# Patient Record
Sex: Female | Born: 1975
Health system: Southern US, Community
[De-identification: ages and names within clinical notes are randomized; demographics above are authoritative.]

## PROBLEM LIST (undated history)

## (undated) DIAGNOSIS — E119 Type 2 diabetes mellitus without complications: Secondary | ICD-10-CM

## (undated) DIAGNOSIS — E785 Hyperlipidemia, unspecified: Secondary | ICD-10-CM

## (undated) DIAGNOSIS — J45909 Unspecified asthma, uncomplicated: Secondary | ICD-10-CM

## (undated) DIAGNOSIS — I10 Essential (primary) hypertension: Secondary | ICD-10-CM

## (undated) DIAGNOSIS — D500 Iron deficiency anemia secondary to blood loss (chronic): Secondary | ICD-10-CM

## (undated) HISTORY — PX: CHOLECYSTECTOMY: SHX55

## (undated) HISTORY — DX: Hyperlipidemia, unspecified: E78.5

## (undated) HISTORY — DX: Type 2 diabetes mellitus without complications: E11.9

## (undated) HISTORY — DX: Iron deficiency anemia secondary to blood loss (chronic): D50.0

## (undated) HISTORY — DX: Essential (primary) hypertension: I10

## (undated) HISTORY — DX: Unspecified asthma, uncomplicated: J45.909

## (undated) MED FILL — Iron Sucrose Inj 20 MG/ML (Fe Equiv): INTRAVENOUS | Qty: 10 | Status: AC

---

## 2011-01-07 ENCOUNTER — Emergency Department: Payer: Self-pay | Admitting: Emergency Medicine
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
## 2017-02-08 DIAGNOSIS — N920 Excessive and frequent menstruation with regular cycle: Secondary | ICD-10-CM

## 2017-02-08 DIAGNOSIS — R21 Rash and other nonspecific skin eruption: Secondary | ICD-10-CM

## 2017-02-08 DIAGNOSIS — D473 Essential (hemorrhagic) thrombocythemia: Secondary | ICD-10-CM

## 2017-02-08 DIAGNOSIS — D509 Iron deficiency anemia, unspecified: Secondary | ICD-10-CM

## 2017-03-27 DIAGNOSIS — Z862 Personal history of diseases of the blood and blood-forming organs and certain disorders involving the immune mechanism: Secondary | ICD-10-CM | POA: Diagnosis not present

## 2017-12-05 DIAGNOSIS — I10 Essential (primary) hypertension: Secondary | ICD-10-CM | POA: Diagnosis not present

## 2017-12-05 DIAGNOSIS — N920 Excessive and frequent menstruation with regular cycle: Secondary | ICD-10-CM | POA: Diagnosis not present

## 2017-12-05 DIAGNOSIS — D508 Other iron deficiency anemias: Secondary | ICD-10-CM | POA: Diagnosis not present

## 2018-04-03 DIAGNOSIS — D696 Thrombocytopenia, unspecified: Secondary | ICD-10-CM | POA: Diagnosis not present

## 2018-04-03 DIAGNOSIS — D509 Iron deficiency anemia, unspecified: Secondary | ICD-10-CM | POA: Diagnosis not present

## 2018-04-29 DIAGNOSIS — Z1231 Encounter for screening mammogram for malignant neoplasm of breast: Secondary | ICD-10-CM | POA: Diagnosis not present

## 2018-05-28 DIAGNOSIS — N939 Abnormal uterine and vaginal bleeding, unspecified: Secondary | ICD-10-CM | POA: Diagnosis not present

## 2018-06-04 DIAGNOSIS — D500 Iron deficiency anemia secondary to blood loss (chronic): Secondary | ICD-10-CM | POA: Diagnosis not present

## 2018-06-04 DIAGNOSIS — D508 Other iron deficiency anemias: Secondary | ICD-10-CM | POA: Diagnosis not present

## 2018-06-04 DIAGNOSIS — N920 Excessive and frequent menstruation with regular cycle: Secondary | ICD-10-CM | POA: Diagnosis not present

## 2018-06-12 DIAGNOSIS — D500 Iron deficiency anemia secondary to blood loss (chronic): Secondary | ICD-10-CM | POA: Diagnosis not present

## 2018-06-18 DIAGNOSIS — D500 Iron deficiency anemia secondary to blood loss (chronic): Secondary | ICD-10-CM | POA: Diagnosis not present

## 2018-08-18 DIAGNOSIS — D500 Iron deficiency anemia secondary to blood loss (chronic): Secondary | ICD-10-CM | POA: Diagnosis not present

## 2018-08-18 DIAGNOSIS — N920 Excessive and frequent menstruation with regular cycle: Secondary | ICD-10-CM | POA: Diagnosis not present

## 2018-08-18 DIAGNOSIS — D508 Other iron deficiency anemias: Secondary | ICD-10-CM | POA: Diagnosis not present

## 2018-12-30 DIAGNOSIS — D500 Iron deficiency anemia secondary to blood loss (chronic): Secondary | ICD-10-CM | POA: Diagnosis not present

## 2018-12-30 DIAGNOSIS — N920 Excessive and frequent menstruation with regular cycle: Secondary | ICD-10-CM | POA: Diagnosis not present

## 2019-01-06 DIAGNOSIS — D500 Iron deficiency anemia secondary to blood loss (chronic): Secondary | ICD-10-CM | POA: Diagnosis not present

## 2019-01-13 DIAGNOSIS — D500 Iron deficiency anemia secondary to blood loss (chronic): Secondary | ICD-10-CM | POA: Diagnosis not present

## 2019-03-24 DIAGNOSIS — D500 Iron deficiency anemia secondary to blood loss (chronic): Secondary | ICD-10-CM | POA: Diagnosis not present

## 2019-03-24 DIAGNOSIS — N920 Excessive and frequent menstruation with regular cycle: Secondary | ICD-10-CM | POA: Diagnosis not present

## 2019-03-24 DIAGNOSIS — D509 Iron deficiency anemia, unspecified: Secondary | ICD-10-CM | POA: Diagnosis not present

## 2019-04-01 DIAGNOSIS — R030 Elevated blood-pressure reading, without diagnosis of hypertension: Secondary | ICD-10-CM | POA: Diagnosis not present

## 2019-07-09 DIAGNOSIS — D649 Anemia, unspecified: Secondary | ICD-10-CM

## 2019-07-09 DIAGNOSIS — D500 Iron deficiency anemia secondary to blood loss (chronic): Secondary | ICD-10-CM

## 2020-06-16 DIAGNOSIS — D649 Anemia, unspecified: Secondary | ICD-10-CM | POA: Diagnosis not present

## 2020-08-25 ENCOUNTER — Other Ambulatory Visit: Payer: Self-pay | Admitting: Hematology and Oncology

## 2020-08-25 ENCOUNTER — Encounter: Payer: Self-pay | Admitting: Hematology and Oncology

## 2020-08-25 DIAGNOSIS — D500 Iron deficiency anemia secondary to blood loss (chronic): Secondary | ICD-10-CM | POA: Diagnosis not present

## 2020-08-25 DIAGNOSIS — D509 Iron deficiency anemia, unspecified: Secondary | ICD-10-CM

## 2020-08-25 HISTORY — DX: Iron deficiency anemia, unspecified: D50.9

## 2020-08-26 ENCOUNTER — Encounter: Payer: Self-pay | Admitting: Hematology and Oncology

## 2020-08-30 ENCOUNTER — Other Ambulatory Visit: Payer: Self-pay | Admitting: Pharmacist

## 2020-09-02 ENCOUNTER — Other Ambulatory Visit: Payer: Self-pay

## 2020-09-02 ENCOUNTER — Inpatient Hospital Stay: Payer: 59 | Attending: Oncology

## 2020-09-02 VITALS — BP 154/69 | HR 84 | Temp 97.9°F | Resp 20

## 2020-09-02 DIAGNOSIS — N920 Excessive and frequent menstruation with regular cycle: Secondary | ICD-10-CM | POA: Insufficient documentation

## 2020-09-02 DIAGNOSIS — D500 Iron deficiency anemia secondary to blood loss (chronic): Secondary | ICD-10-CM

## 2020-09-02 MED ORDER — SODIUM CHLORIDE 0.9 % IV SOLN
Freq: Once | INTRAVENOUS | Status: AC
  Filled 2020-09-02: qty 250

## 2020-09-02 MED ORDER — SODIUM CHLORIDE 0.9 % IV SOLN
200.0000 mg | Freq: Once | INTRAVENOUS | Status: AC
  Administered 2020-09-02: 200 mg via INTRAVENOUS
  Filled 2020-09-02: qty 200

## 2020-09-02 NOTE — Progress Notes (Signed)
Pt stable at time of discharge. 

## 2020-09-02 NOTE — Patient Instructions (Signed)

## 2020-09-05 ENCOUNTER — Inpatient Hospital Stay: Payer: 59

## 2020-09-05 NOTE — Telephone Encounter (Signed)
This encounter was created in error - please disregard.

## 2020-09-06 ENCOUNTER — Inpatient Hospital Stay: Payer: 59

## 2020-09-06 ENCOUNTER — Other Ambulatory Visit: Payer: Self-pay

## 2020-09-06 VITALS — BP 139/70 | HR 85 | Temp 98.4°F | Resp 18 | Ht 60.0 in | Wt 354.2 lb

## 2020-09-06 DIAGNOSIS — D500 Iron deficiency anemia secondary to blood loss (chronic): Secondary | ICD-10-CM

## 2020-09-06 MED ORDER — SODIUM CHLORIDE 0.9 % IV SOLN
200.0000 mg | Freq: Once | INTRAVENOUS | Status: AC
  Administered 2020-09-06: 200 mg via INTRAVENOUS
  Filled 2020-09-06: qty 200

## 2020-09-06 MED ORDER — SODIUM CHLORIDE 0.9 % IV SOLN
Freq: Once | INTRAVENOUS | Status: AC
  Filled 2020-09-06: qty 250

## 2020-09-06 NOTE — Patient Instructions (Signed)
Iron Deficiency Anemia, Adult Iron-deficiency anemia is when you have a low amount of red blood cells or hemoglobin. This happens because you have too little iron in your body. Hemoglobin carries oxygen to parts of the body. Anemia can cause your body to not get enough oxygen. It may or may not cause symptoms. Follow these instructions at home: Medicines  Take over-the-counter and prescription medicines only as told by your doctor. This includes iron pills (supplements) and vitamins.  If you cannot handle taking iron pills by mouth, ask your doctor about getting iron through: ? A vein (intravenously). ? A shot (injection) into a muscle.  Take iron pills when your stomach is empty. If you cannot handle this, take them with food.  Do not drink milk or take antacids at the same time as your iron pills.  To prevent trouble pooping (constipation), eat fiber or take medicine (stool softener) as told by your doctor. Eating and drinking   Talk with your doctor before changing the foods you eat. He or she may tell you to eat foods that have a lot of iron, such as: ? Liver. ? Lowfat (lean) beef. ? Breads and cereals that have iron added to them (fortified breads and cereals). ? Eggs. ? Dried fruit. ? Dark green, leafy vegetables.  Drink enough fluid to keep your pee (urine) clear or pale yellow.  Eat fresh fruits and vegetables that are high in vitamin C. They help your body to use iron. Foods with a lot of vitamin C include: ? Oranges. ? Peppers. ? Tomatoes. ? Mangoes. General instructions  Return to your normal activities as told by your doctor. Ask your doctor what activities are safe for you.  Keep yourself clean, and keep things clean around you (your surroundings). Anemia can make you get sick more easily.  Keep all follow-up visits as told by your doctor. This is important. Contact a doctor if:  You feel sick to your stomach (nauseous).  You throw up (vomit).  You feel  weak.  You are sweating for no clear reason.  You have trouble pooping, such as: ? Pooping (having a bowel movement) less than 3 times a week. ? Straining to poop. ? Having poop that is hard, dry, or larger than normal. ? Feeling full or bloated. ? Pain in the lower belly. ? Not feeling better after pooping. Get help right away if:  You pass out (faint). If this happens, do not drive yourself to the hospital. Call your local emergency services (911 in the [place]  You have chest pain.  You have shortness of breath that: ? Is very bad. ? Gets worse with physical activity.  You have a fast heartbeat.  You get light-headed when getting up from sitting or lying down. This information is not intended to replace advice given to you by your health care provider. Make sure you discuss any questions you have with your health care provider. Document Revised: 10/18/2017 Document Reviewed: 07/25/2016 Elsevier Patient Education  [place]. Iron Sucrose injection What is this medicine? IRON SUCROSE (AHY ern SOO krohs) is an iron complex. Iron is used to make healthy red blood cells, which carry oxygen and nutrients throughout the body. This medicine is used to treat iron deficiency anemia in people with chronic kidney disease. This medicine may be used for other purposes; ask your health care provider or pharmacist if you have questions. COMMON BRAND NAME(S): Venofer What should I tell my health care provider before I take  this medicine? They need to know if you have any of these conditions:  anemia not caused by low iron levels  heart disease  high levels of iron in the blood  kidney disease  liver disease  an unusual or allergic reaction to iron, other medicines, foods, dyes, or preservatives  pregnant or trying to get pregnant  breast-feeding How should I use this medicine? This medicine is for infusion into a vein. It is given by a health care professional in a  hospital or clinic setting. Talk to your pediatrician regarding the use of this medicine in children. While this drug may be prescribed for children as young as 2 years for selected conditions, precautions do apply. Overdosage: If you think you have taken too much of this medicine contact a poison control center or emergency room at once. NOTE: This medicine is only for you. Do not share this medicine with others. What if I miss a dose? It is important not to miss your dose. Call your doctor or health care professional if you are unable to keep an appointment. What may interact with this medicine? Do not take this medicine with any of the following medications:  deferoxamine  dimercaprol  other iron products This medicine may also interact with the following medications:  chloramphenicol  deferasirox This list may not describe all possible interactions. Give your health care provider a list of all the medicines, herbs, non-prescription drugs, or dietary supplements you use. Also tell them if you smoke, drink alcohol, or use illegal drugs. Some items may interact with your medicine. What should I watch for while using this medicine? Visit your doctor or healthcare professional regularly. Tell your doctor or healthcare professional if your symptoms do not start to get better or if they get worse. You may need blood work done while you are taking this medicine. You may need to follow a special diet. Talk to your doctor. Foods that contain iron include: whole grains/cereals, dried fruits, beans, or peas, leafy green vegetables, and organ meats (liver, kidney). What side effects may I notice from receiving this medicine? Side effects that you should report to your doctor or health care professional as soon as possible:  allergic reactions like skin rash, itching or hives, swelling of the face, lips, or tongue  breathing problems  changes in blood pressure  cough  fast, irregular  heartbeat  feeling faint or lightheaded, falls  fever or chills  flushing, sweating, or hot feelings  joint or muscle aches/pains  seizures  swelling of the ankles or feet  unusually weak or tired Side effects that usually do not require medical attention (report to your doctor or health care professional if they continue or are bothersome):  diarrhea  feeling achy  headache  irritation at site where injected  nausea, vomiting  stomach upset  tiredness This list may not describe all possible side effects. Call your doctor for medical advice about side effects. You may report side effects to FDA at 1-800-FDA-1088. Where should I keep my medicine? This drug is given in a hospital or clinic and will not be stored at home. NOTE: This sheet is a summary. It may not cover all possible information. If you have questions about this medicine, talk to your doctor, pharmacist, or health care provider.  2020 Elsevier/Gold Standard (2011-08-16 17:14:35)

## 2020-09-06 NOTE — Progress Notes (Signed)
Pt stable at time of discharge. 

## 2020-09-08 ENCOUNTER — Other Ambulatory Visit: Payer: Self-pay

## 2020-09-08 ENCOUNTER — Inpatient Hospital Stay: Payer: 59

## 2020-09-08 DIAGNOSIS — D500 Iron deficiency anemia secondary to blood loss (chronic): Secondary | ICD-10-CM

## 2020-09-08 MED ORDER — SODIUM CHLORIDE 0.9 % IV SOLN
200.0000 mg | Freq: Once | INTRAVENOUS | Status: AC
  Administered 2020-09-08: 200 mg via INTRAVENOUS
  Filled 2020-09-08: qty 200

## 2020-09-08 MED ORDER — SODIUM CHLORIDE 0.9 % IV SOLN
Freq: Once | INTRAVENOUS | Status: AC
  Filled 2020-09-08: qty 250

## 2020-09-08 NOTE — Progress Notes (Signed)
PT STABLE AT TIME OF DISCHARGE 

## 2020-09-08 NOTE — Patient Instructions (Signed)

## 2020-09-12 ENCOUNTER — Inpatient Hospital Stay: Payer: 59

## 2020-09-12 ENCOUNTER — Other Ambulatory Visit: Payer: Self-pay

## 2020-09-12 VITALS — BP 161/70 | HR 78 | Temp 98.3°F | Resp 18

## 2020-09-12 DIAGNOSIS — D500 Iron deficiency anemia secondary to blood loss (chronic): Secondary | ICD-10-CM | POA: Diagnosis not present

## 2020-09-12 MED ORDER — SODIUM CHLORIDE 0.9 % IV SOLN
Freq: Once | INTRAVENOUS | Status: AC
  Filled 2020-09-12: qty 250

## 2020-09-12 MED ORDER — SODIUM CHLORIDE 0.9 % IV SOLN
200.0000 mg | Freq: Once | INTRAVENOUS | Status: AC
  Administered 2020-09-12: 200 mg via INTRAVENOUS
  Filled 2020-09-12: qty 200

## 2020-09-12 NOTE — Progress Notes (Signed)
PT STABLE AT TIME OF DISCHARGE 1610

## 2020-09-12 NOTE — Patient Instructions (Signed)

## 2020-09-14 ENCOUNTER — Other Ambulatory Visit: Payer: Self-pay

## 2020-09-14 ENCOUNTER — Inpatient Hospital Stay: Payer: 59

## 2020-09-14 VITALS — BP 144/74 | HR 73 | Temp 98.9°F | Resp 18 | Ht 65.0 in | Wt 353.2 lb

## 2020-09-14 DIAGNOSIS — D500 Iron deficiency anemia secondary to blood loss (chronic): Secondary | ICD-10-CM

## 2020-09-14 MED ORDER — SODIUM CHLORIDE 0.9 % IV SOLN
200.0000 mg | Freq: Once | INTRAVENOUS | Status: AC
  Administered 2020-09-14: 200 mg via INTRAVENOUS
  Filled 2020-09-14: qty 200

## 2020-09-14 MED ORDER — SODIUM CHLORIDE 0.9 % IV SOLN
Freq: Once | INTRAVENOUS | Status: AC
  Filled 2020-09-14: qty 250

## 2020-09-14 NOTE — Progress Notes (Signed)
PT STABLE AT TIME OF DISCHARGE 

## 2020-09-14 NOTE — Patient Instructions (Signed)

## 2020-09-15 NOTE — Progress Notes (Signed)
PT STABLE AT TIME OF DISCHARGE 

## 2020-09-27 ENCOUNTER — Other Ambulatory Visit: Payer: Self-pay | Admitting: Hematology and Oncology

## 2020-09-27 DIAGNOSIS — D500 Iron deficiency anemia secondary to blood loss (chronic): Secondary | ICD-10-CM

## 2020-10-06 NOTE — Progress Notes (Signed)
[facility]  [address] [place],  [place]  [address] [phone]  Clinic Day:  10/06/2020  Referring physician: Derwood Kaplan, MD   This document serves as a record of services personally performed by Hosie Poisson, MD. It was created on their behalf by Curry,Lauren E, a trained medical scribe. The creation of this record is based on the scribe's personal observations and the provider's statements to them.   CHIEF COMPLAINT:  CC:  Longstanding iron deficiency anemia  Current Treatment:  Intermittent IV Feraheme   HISTORY OF PRESENT ILLNESS:  Leah Mendoza is a 44 y.o. female with microcytic anemia secondary to iron deficiency with associated reactive thrombocytosis.  This has been an ongoing problem for years.  She does not tolerate oral iron supplementation.  This has been felt to be secondary to heavy menstrual periods.  She has been referred to gynecology in the past, but apparently there are financial issues.  She had an EGD in April 2013 with Dr. Lyda Jester for upper abdominal pain, which did not reveal any abnormality.  She had colonoscopy with Dr. Melina Copa in June 2015, which was also normal.  Routine screening colonoscopy at age 27 was recommended.  She has received numerous IV iron infusions over the years when she becomes more anemic and symptomatic.  CT abdomen and pelvis in June 2015 revealed endometrial thickening, for which we referred her to [facility] in June 2015, but she never went.  Repeat CT abdomen and pelvis and February 2016 in the emergency department revealed a left adnexal soft tissue density most consistent with an ovarian cyst.  Pelvic ultrasound was recommended and revealed bilateral ovarian cysts.  The endometrium looked normal on the ultrasound.  She was referred back to [facility] again in May 2019, but did not go.  We started screening mammograms in June 2019.  Most recent screening  mammogram in July 2020 did not reveal any evidence of malignancy.  She received IV Feraheme again in February and March of 2021 due to recurrent iron deficiency, at which time her hemoglobin was 10.7.     INTERVAL HISTORY:  Leah Mendoza is here for routine follow up and states that she has been well since receiving IV Feraheme in October.  She just finished her menstrual cycle and states that this cycle was very heavy.  Otherwise, she denies complaints.  Her  appetite is good, and her weight is stable since her last visit.  She denies fever, chills or other signs of infection.  She denies nausea, vomiting, bowel issues, or abdominal pain.  She denies sore throat, cough, dyspnea, or chest pain.   REVIEW OF SYSTEMS:  Review of Systems  Genitourinary: Positive for menstrual problem (very heavy menstrual cycles).   All other systems reviewed and are negative.    VITALS:  There were no vitals taken for this visit.  Wt Readings from Last 3 Encounters:  09/14/20 (!) 353 lb 4 oz (160.2 kg)  09/12/20 (!) 355 lb 7 oz (161.2 kg)  09/12/20 (!) 355 lb 7 oz (161.2 kg)    There is no height or weight on file to calculate BMI.  Performance status (ECOG): 1 - Symptomatic but completely ambulatory  PHYSICAL EXAM:  Physical Exam Constitutional:      General: She is not in acute distress.    Appearance: Normal appearance. She is obese.  HENT:     Head: Normocephalic and atraumatic.  Eyes:     General: No scleral  icterus.    Extraocular Movements: Extraocular movements intact.     Conjunctiva/sclera: Conjunctivae normal.     Pupils: Pupils are equal, round, and reactive to light.  Cardiovascular:     Rate and Rhythm: Normal rate and regular rhythm.     Pulses: Normal pulses.     Heart sounds: Normal heart sounds. No murmur heard.  No friction rub. No gallop.   Pulmonary:     Effort: Pulmonary effort is normal. No respiratory distress.     Breath sounds: Normal breath sounds.  Abdominal:      General: Bowel sounds are normal. There is no distension.     Palpations: Abdomen is soft. There is no mass.     Tenderness: There is no abdominal tenderness.  Musculoskeletal:        General: Normal range of motion.     Cervical back: Normal range of motion and neck supple.     Right lower leg: No edema.     Left lower leg: No edema.  Lymphadenopathy:     Cervical: No cervical adenopathy.  Skin:    General: Skin is warm and dry.  Neurological:     General: No focal deficit present.     Mental Status: She is alert and oriented to person, place, and time. Mental status is at baseline.  Psychiatric:        Mood and Affect: Mood normal.        Behavior: Behavior normal.        Thought Content: Thought content normal.        Judgment: Judgment normal.     LABS:  No flowsheet data found. No flowsheet data found.  No results found for: TIBC, FERRITIN, IRONPCTSAT   STUDIES:  No results found.   Allergies:  Allergies  Allergen Reactions  . Latex     UNKNOWN    Current Medications: Current Outpatient Medications  Medication Sig Dispense Refill  . acetaminophen (TYLENOL) 325 MG tablet Take 650 mg by mouth every 6 (six) hours as needed.     No current facility-administered medications for this visit.     ASSESSMENT & PLAN:   Assessment:   1. Longstanding iron deficiency anemia due to heavy menses.  She has had an excellent response to IV Feraheme, but requires recurring doses due to ongoing blood loss.  She received IV Feraheme infusion in March and October of this year.    Plan: We will see her back in 3 months with CBC, CMP, iand ron studes for repeat evaluation.  She verbalizes understanding of an agreement to the plans discussed today.  She knows to call the office should any new questions or concerns arise.   I provided 10 minutes of face-to-face time during this this encounter and > 50% was spent counseling as documented under my assessment and plan.     Derwood Kaplan, MD [facility] [address] [place] [address] Dept: [phone] Dept Fax: [phone]   I, Rita Ohara, am acting as scribe for Derwood Kaplan, MD  I have reviewed this report as typed by the medical scribe, and it is complete and accurate.

## 2020-10-07 ENCOUNTER — Other Ambulatory Visit: Payer: Self-pay

## 2020-10-07 ENCOUNTER — Inpatient Hospital Stay (INDEPENDENT_AMBULATORY_CARE_PROVIDER_SITE_OTHER): Payer: 59 | Admitting: Oncology

## 2020-10-07 ENCOUNTER — Inpatient Hospital Stay: Payer: 59 | Attending: Oncology

## 2020-10-07 ENCOUNTER — Telehealth: Payer: Self-pay | Admitting: Oncology

## 2020-10-07 ENCOUNTER — Other Ambulatory Visit: Payer: Self-pay | Admitting: Oncology

## 2020-10-07 ENCOUNTER — Encounter: Payer: Self-pay | Admitting: Oncology

## 2020-10-07 VITALS — BP 170/81 | HR 77 | Temp 98.8°F | Resp 18 | Ht 65.0 in | Wt 355.0 lb

## 2020-10-07 DIAGNOSIS — N920 Excessive and frequent menstruation with regular cycle: Secondary | ICD-10-CM | POA: Insufficient documentation

## 2020-10-07 DIAGNOSIS — D500 Iron deficiency anemia secondary to blood loss (chronic): Secondary | ICD-10-CM

## 2020-10-07 LAB — CBC WITH DIFFERENTIAL (CANCER CENTER ONLY)
Abs Immature Granulocytes: 0.02 10*3/uL (ref 0.00–0.07)
Basophils Absolute: 0.1 10*3/uL (ref 0.0–0.1)
Basophils Relative: 1 %
Eosinophils Absolute: 0.3 10*3/uL (ref 0.0–0.5)
Eosinophils Relative: 3 %
HCT: 41.3 % (ref 36.0–46.0)
Hemoglobin: 12.9 g/dL (ref 12.0–15.0)
Immature Granulocytes: 0 %
Lymphocytes Relative: 33 %
Lymphs Abs: 2.7 10*3/uL (ref 0.7–4.0)
MCH: 27.4 pg (ref 26.0–34.0)
MCHC: 31.2 g/dL (ref 30.0–36.0)
MCV: 87.7 fL (ref 80.0–100.0)
Monocytes Absolute: 0.6 10*3/uL (ref 0.1–1.0)
Monocytes Relative: 7 %
Neutro Abs: 4.6 10*3/uL (ref 1.7–7.7)
Neutrophils Relative %: 56 %
Platelet Count: 383 10*3/uL (ref 150–400)
RBC: 4.71 MIL/uL (ref 3.87–5.11)
RDW: 16 % — ABNORMAL HIGH (ref 11.5–15.5)
WBC Count: 8.3 10*3/uL (ref 4.0–10.5)
nRBC: 0 % (ref 0.0–0.2)

## 2020-10-07 LAB — IRON AND TIBC
Iron: 85 ug/dL (ref 28–170)
Saturation Ratios: 22 % (ref 10.4–31.8)
TIBC: 395 ug/dL (ref 250–450)
UIBC: 310 ug/dL

## 2020-10-07 LAB — FERRITIN: Ferritin: 57 ng/mL (ref 11–307)

## 2020-10-07 NOTE — Telephone Encounter (Signed)
Per 11/19 LOS, patient scheduled Feb 2022 Appt's - Gave patient summary to patient

## 2020-10-10 LAB — SOLUBLE TRANSFERRIN RECEPTOR: Transferrin Receptor: 25.6 nmol/L (ref 12.2–27.3)

## 2020-10-11 ENCOUNTER — Telehealth: Payer: Self-pay

## 2020-10-11 NOTE — Telephone Encounter (Signed)
-----   Message from Derwood Kaplan, MD sent at 10/11/2020  1:10 PM EST ----- Regarding: call pt Tell her labs look good, hgb 12.9

## 2020-10-11 NOTE — Telephone Encounter (Signed)
Patient notified of lab results

## 2021-01-04 ENCOUNTER — Inpatient Hospital Stay: Payer: 59 | Attending: Oncology

## 2021-01-04 ENCOUNTER — Telehealth: Payer: Self-pay | Admitting: Hematology and Oncology

## 2021-01-04 ENCOUNTER — Other Ambulatory Visit: Payer: Self-pay

## 2021-01-04 ENCOUNTER — Encounter: Payer: Self-pay | Admitting: Hematology and Oncology

## 2021-01-04 ENCOUNTER — Other Ambulatory Visit: Payer: Self-pay | Admitting: Hematology and Oncology

## 2021-01-04 ENCOUNTER — Inpatient Hospital Stay (INDEPENDENT_AMBULATORY_CARE_PROVIDER_SITE_OTHER): Payer: 59 | Admitting: Hematology and Oncology

## 2021-01-04 VITALS — BP 168/81 | HR 97 | Temp 98.3°F | Resp 18 | Ht 65.0 in | Wt 359.3 lb

## 2021-01-04 DIAGNOSIS — D509 Iron deficiency anemia, unspecified: Secondary | ICD-10-CM | POA: Insufficient documentation

## 2021-01-04 DIAGNOSIS — D500 Iron deficiency anemia secondary to blood loss (chronic): Secondary | ICD-10-CM

## 2021-01-04 LAB — CBC: RBC: 4.31 (ref 3.87–5.11)

## 2021-01-04 LAB — HEPATIC FUNCTION PANEL
ALT: 20 (ref 7–35)
AST: 35 (ref 13–35)
Alkaline Phosphatase: 83 (ref 25–125)
Bilirubin, Total: 0.5

## 2021-01-04 LAB — CBC AND DIFFERENTIAL
HCT: 37 (ref 36–46)
Hemoglobin: 12.6 (ref 12.0–16.0)
Neutrophils Absolute: 4.99
Platelets: 438 — AB (ref 150–399)
WBC: 8.6

## 2021-01-04 LAB — IRON,TIBC AND FERRITIN PANEL
%SAT: 9.7
Ferritin: 9
Iron: 41
TIBC: 420

## 2021-01-04 LAB — COMPREHENSIVE METABOLIC PANEL
Albumin: 4.4 (ref 3.5–5.0)
Calcium: 9 (ref 8.7–10.7)

## 2021-01-04 LAB — BASIC METABOLIC PANEL
BUN: 11 (ref 4–21)
CO2: 28 — AB (ref 13–22)
Chloride: 102 (ref 99–108)
Creatinine: 0.7 (ref 0.5–1.1)
Glucose: 135
Potassium: 3.9 (ref 3.4–5.3)
Sodium: 138 (ref 137–147)

## 2021-01-04 NOTE — Progress Notes (Signed)
[facility]  [address] [place],  [place]  [address] [phone]  Clinic Day:  01/05/2021  Referring physician: Derwood Kaplan, MD   CHIEF COMPLAINT:  CC:   Iron deficiency anemia  Current Treatment:   IV iron replacement as needed   HISTORY OF PRESENT ILLNESS:  Leah Mendoza is a 45 y.o. female with microcytic anemia secondary to iron deficiency with associated reactive thrombocytosis.  This has been an ongoing problem for years, felt to be due to menorrhagia.  She does not tolerate oral iron supplementation, so receives IV iron in the form of Feraheme on a as needed basis.  She has been referred to gynecology in the past, but apparently there are financial issues.  She had an EGD in April 2013 with Dr. Lyda Jester for upper abdominal pain, which did not reveal any abnormality.  She had colonoscopy with Dr. Melina Copa in June 2015, which was also normal.  Routine screening colonoscopy at age 36 was recommended, but now that will changed age 81.  She has received numerous IV iron infusions over the years when she becomes more anemic and symptomatic.  CT abdomen and pelvis in June 2015 revealed endometrial thickening, for which we referred her to [facility] in June 2015, but she never went.  Repeat CT abdomen and pelvis and February 2016 in the emergency department revealed a left adnexal soft tissue density most consistent with an ovarian cyst.  Pelvic ultrasound was recommended and revealed bilateral ovarian cysts.  The endometrium looked normal on the ultrasound.  She was referred back to [facility] again in May 2019, but she did not go.  She received IV Feraheme again in February and March 2021 due to recurrent iron deficiency. Bilateral screening mammogram in July 2021 did not reveal any evidence of malignancy.  She received IV Feraheme again in October.  INTERVAL HISTORY:  Leah Mendoza is here today for repeat clinical  assessment. She states she has been feeling fairly well except for general body aches since her menses last month.  She states her most menstrual periods have been lighter than normal and for only 3 days. She denies progressive fatigue concerning for recurrent anemia.  She states she had bronchitis a few weeks ago, but COVID testing was negative.  She did not require antibiotic treatment. She denies fevers or chills. She  denies pain. Her appetite is good. Her weight has increased 4 pounds over last 3 months.  REVIEW OF SYSTEMS:  Review of Systems  Constitutional: Negative for appetite change, chills, fatigue, fever and unexpected weight change.  HENT:   Negative for lump/mass, mouth sores and sore throat.   Respiratory: Negative for cough and shortness of breath.   Cardiovascular: Negative for chest pain and leg swelling.  Gastrointestinal: Negative for abdominal pain, constipation, diarrhea, nausea and vomiting.  Genitourinary: Negative for difficulty urinating, dysuria, frequency and hematuria.   Musculoskeletal: Positive for myalgias. Negative for arthralgias and back pain.  Skin: Negative for rash.  Neurological: Negative for dizziness and headaches.  Hematological: Does not bruise/bleed easily.  Psychiatric/Behavioral: Negative for depression and sleep disturbance. The patient is not nervous/anxious.     VITALS:  Blood pressure (!) 168/81, pulse 97, temperature 98.3 F (36.8 C), temperature source Oral, resp. rate 18, height 5\' 5"  (1.651 m), weight (!) 359 lb 4.8 oz (163 kg), SpO2 95 %.  Wt Readings from Last 3 Encounters:  01/04/21 (!) 359 lb 4.8 oz (163 kg)  10/07/20 (!) 355  lb (161 kg)  09/14/20 (!) 353 lb 4 oz (160.2 kg)    Body mass index is 59.79 kg/m.  Performance status (ECOG): 1 - Symptomatic but completely ambulatory  PHYSICAL EXAM:  Physical Exam Vitals and nursing note reviewed.  Constitutional:      General: She is not in acute distress.    Appearance: Normal  appearance. She is obese. She is not ill-appearing.  HENT:     Mouth/Throat:     Mouth: Mucous membranes are moist.     Pharynx: Oropharynx is clear. No oropharyngeal exudate or posterior oropharyngeal erythema.  Eyes:     General: No scleral icterus.    Extraocular Movements: Extraocular movements intact.     Conjunctiva/sclera: Conjunctivae normal.     Pupils: Pupils are equal, round, and reactive to light.  Cardiovascular:     Rate and Rhythm: Normal rate and regular rhythm.     Heart sounds: Normal heart sounds. No murmur heard. No friction rub. No gallop.   Pulmonary:     Effort: Pulmonary effort is normal. No respiratory distress.     Breath sounds: Normal breath sounds. No stridor. No wheezing, rhonchi or rales.  Chest:  Breasts:     Right: No axillary adenopathy or supraclavicular adenopathy.     Left: No axillary adenopathy or supraclavicular adenopathy.    Abdominal:     General: There is no distension.     Palpations: Abdomen is soft. There is no hepatomegaly, splenomegaly or mass.     Tenderness: There is no abdominal tenderness. There is no guarding.  Musculoskeletal:        General: Normal range of motion.     Cervical back: Normal range of motion and neck supple. No tenderness.     Right lower leg: No edema.     Left lower leg: No edema.  Lymphadenopathy:     Cervical: No cervical adenopathy.     Upper Body:     Right upper body: No supraclavicular or axillary adenopathy.     Left upper body: No supraclavicular or axillary adenopathy.     Lower Body: No right inguinal adenopathy. No left inguinal adenopathy.  Skin:    General: Skin is warm and dry.     Coloration: Skin is not jaundiced.     Findings: No rash.  Neurological:     Mental Status: She is alert and oriented to person, place, and time.     Cranial Nerves: No cranial nerve deficit.  Psychiatric:        Mood and Affect: Mood normal.        Behavior: Behavior normal.        Thought Content:  Thought content normal.    LABS:   CBC Latest Ref Rng & Units 01/04/2021 10/07/2020  WBC - 8.6 8.3  Hemoglobin 12.0 - 16.0 12.6 12.9  Hematocrit 36 - 46 37 41.3  Platelets 150 - 399 438(A) 383   CMP Latest Ref Rng & Units 01/04/2021  BUN 4 - 21 11  Creatinine 0.5 - 1.1 0.7  Sodium 137 - 147 138  Potassium 3.4 - 5.3 3.9  Chloride 99 - 108 102  CO2 13 - 22 28(A)  Calcium 8.7 - 10.7 9.0  Alkaline Phos 25 - 125 83  AST 13 - 35 35  ALT 7 - 35 20     No results found for: CEA1 / No results found for: CEA1 No results found for: PSA1 No results found for: NOI370 No results found  for: LSL373  No results found for: Ronnald Ramp, A1GS, A2GS, BETS, BETA2SER, GAMS, MSPIKE, SPEI Lab Results  Component Value Date   TIBC 420 01/04/2021   TIBC 395 10/07/2020   FERRITIN 9.0 01/04/2021   FERRITIN 57 10/07/2020   IRONPCTSAT 9.7 01/04/2021   IRONPCTSAT 22 10/07/2020   No results found for: LDH  STUDIES:  No results found.    HISTORY:   Past Medical History:  Diagnosis Date  . Iron deficiency anemia 08/25/2020  . Iron deficiency anemia secondary to blood loss (chronic)   . Iron deficiency anemia secondary to blood loss (chronic)     History reviewed. No pertinent surgical history.  History reviewed. No pertinent family history.  Social History:  reports that she has never smoked. She has never used smokeless tobacco. She reports that she does not use drugs. No history on file for alcohol use.The patient is alone today.  Allergies:  Allergies  Allergen Reactions  . Latex     UNKNOWN    Current Medications: Current Outpatient Medications  Medication Sig Dispense Refill  . acetaminophen (TYLENOL) 325 MG tablet Take 650 mg by mouth every 6 (six) hours as needed.     No current facility-administered medications for this visit.     ASSESSMENT & PLAN:   Assessment:  Leah Mendoza is a 45 y.o. female  with a longstanding history of iron  deficiency anemia with associated reactive thrombocytosis due to menorrhagia.  Her hemoglobin is normal today, although, her platelets remain mildly elevated.  Iron studies are consistent with recurrent iron deficiency..  Plan:  We will arange for her to have IV Feraheme again at this time.  We will then plan to see her back in 3 months for repeat clinical assessment. The patient understands the plans discussed today and is in agreement with them.  She knows to contact our office if she develops concerns prior to her next appointment.      Marvia Pickles, PA-C

## 2021-01-04 NOTE — Telephone Encounter (Signed)
Per 2/16 los next appt sched and given to patient 

## 2021-01-05 ENCOUNTER — Telehealth: Payer: Self-pay

## 2021-01-05 ENCOUNTER — Other Ambulatory Visit: Payer: Self-pay | Admitting: Hematology and Oncology

## 2021-01-05 NOTE — Telephone Encounter (Addendum)
Pt notified.   ----- Message from Marvia Pickles, PA-C sent at 01/05/2021 10:46 AM EST ----- Please let her know her iron level is low again. They will call her to schedule IV iron. Thanks!

## 2021-01-16 ENCOUNTER — Inpatient Hospital Stay: Payer: 59

## 2021-01-16 ENCOUNTER — Other Ambulatory Visit: Payer: Self-pay

## 2021-01-16 VITALS — BP 176/84 | HR 70 | Temp 98.2°F | Resp 18 | Ht 65.0 in | Wt 360.1 lb

## 2021-01-16 DIAGNOSIS — D509 Iron deficiency anemia, unspecified: Secondary | ICD-10-CM | POA: Diagnosis not present

## 2021-01-16 DIAGNOSIS — D500 Iron deficiency anemia secondary to blood loss (chronic): Secondary | ICD-10-CM

## 2021-01-16 MED ORDER — SODIUM CHLORIDE 0.9 % IV SOLN
200.0000 mg | Freq: Once | INTRAVENOUS | Status: AC
  Administered 2021-01-16: 200 mg via INTRAVENOUS
  Filled 2021-01-16: qty 200

## 2021-01-16 MED ORDER — SODIUM CHLORIDE 0.9 % IV SOLN
Freq: Once | INTRAVENOUS | Status: AC
  Filled 2021-01-16: qty 250

## 2021-01-16 NOTE — Progress Notes (Signed)
January 16, 8647   [place]  The above named patient had a medical visit today at: 1500.  Please take this into consideration when reviewing the time away from work/school.    Sincerely,    Quentin Mulling, RN

## 2021-01-16 NOTE — Progress Notes (Signed)
Pt d/c stable at 1603

## 2021-01-16 NOTE — Patient Instructions (Signed)

## 2021-01-17 ENCOUNTER — Inpatient Hospital Stay: Payer: 59 | Attending: Oncology

## 2021-01-17 VITALS — BP 155/82 | HR 85 | Temp 98.2°F | Resp 18 | Ht 65.0 in | Wt 359.1 lb

## 2021-01-17 DIAGNOSIS — D500 Iron deficiency anemia secondary to blood loss (chronic): Secondary | ICD-10-CM

## 2021-01-17 DIAGNOSIS — D509 Iron deficiency anemia, unspecified: Secondary | ICD-10-CM | POA: Diagnosis present

## 2021-01-17 MED ORDER — SODIUM CHLORIDE 0.9 % IV SOLN
200.0000 mg | Freq: Once | INTRAVENOUS | Status: AC
  Administered 2021-01-17: 200 mg via INTRAVENOUS
  Filled 2021-01-17: qty 200

## 2021-01-17 MED ORDER — SODIUM CHLORIDE 0.9 % IV SOLN
Freq: Once | INTRAVENOUS | Status: AC
  Filled 2021-01-17: qty 250

## 2021-01-17 NOTE — Patient Instructions (Signed)

## 2021-01-18 ENCOUNTER — Inpatient Hospital Stay: Payer: 59

## 2021-01-18 ENCOUNTER — Other Ambulatory Visit: Payer: Self-pay

## 2021-01-18 VITALS — BP 170/82 | HR 82 | Temp 98.2°F | Resp 18 | Ht 65.0 in | Wt 359.0 lb

## 2021-01-18 DIAGNOSIS — D509 Iron deficiency anemia, unspecified: Secondary | ICD-10-CM | POA: Diagnosis not present

## 2021-01-18 DIAGNOSIS — D500 Iron deficiency anemia secondary to blood loss (chronic): Secondary | ICD-10-CM

## 2021-01-18 MED ORDER — SODIUM CHLORIDE 0.9 % IV SOLN
Freq: Once | INTRAVENOUS | Status: AC
  Filled 2021-01-18: qty 250

## 2021-01-18 MED ORDER — SODIUM CHLORIDE 0.9 % IV SOLN
200.0000 mg | Freq: Once | INTRAVENOUS | Status: AC
  Administered 2021-01-18: 200 mg via INTRAVENOUS
  Filled 2021-01-18: qty 200

## 2021-01-18 NOTE — Patient Instructions (Signed)

## 2021-01-19 ENCOUNTER — Inpatient Hospital Stay: Payer: 59

## 2021-01-19 VITALS — BP 141/76 | HR 81 | Temp 98.1°F | Resp 18 | Ht 65.0 in | Wt 358.1 lb

## 2021-01-19 DIAGNOSIS — D509 Iron deficiency anemia, unspecified: Secondary | ICD-10-CM | POA: Diagnosis not present

## 2021-01-19 DIAGNOSIS — D500 Iron deficiency anemia secondary to blood loss (chronic): Secondary | ICD-10-CM

## 2021-01-19 MED ORDER — SODIUM CHLORIDE 0.9 % IV SOLN
Freq: Once | INTRAVENOUS | Status: AC
  Filled 2021-01-19: qty 250

## 2021-01-19 MED ORDER — SODIUM CHLORIDE 0.9 % IV SOLN
200.0000 mg | Freq: Once | INTRAVENOUS | Status: AC
  Administered 2021-01-19: 200 mg via INTRAVENOUS
  Filled 2021-01-19: qty 200

## 2021-01-19 NOTE — Patient Instructions (Signed)

## 2021-01-19 NOTE — Progress Notes (Signed)
1612 pt d/c stable home with IV intact and dressed.

## 2021-01-19 NOTE — Progress Notes (Signed)
PT STABLE AT TIME OF DISCHARGE 

## 2021-01-20 ENCOUNTER — Other Ambulatory Visit: Payer: Self-pay

## 2021-01-20 ENCOUNTER — Inpatient Hospital Stay: Payer: 59

## 2021-01-20 VITALS — BP 142/82 | HR 72 | Temp 98.3°F | Resp 18 | Ht 65.0 in | Wt 357.1 lb

## 2021-01-20 DIAGNOSIS — D500 Iron deficiency anemia secondary to blood loss (chronic): Secondary | ICD-10-CM

## 2021-01-20 DIAGNOSIS — D509 Iron deficiency anemia, unspecified: Secondary | ICD-10-CM | POA: Diagnosis not present

## 2021-01-20 MED ORDER — SODIUM CHLORIDE 0.9 % IV SOLN
200.0000 mg | Freq: Once | INTRAVENOUS | Status: AC
  Administered 2021-01-20: 200 mg via INTRAVENOUS
  Filled 2021-01-20: qty 10

## 2021-01-20 MED ORDER — SODIUM CHLORIDE 0.9 % IV SOLN
Freq: Once | INTRAVENOUS | Status: AC
  Filled 2021-01-20: qty 250

## 2021-01-20 NOTE — Progress Notes (Signed)
1550 pt d/c stable

## 2021-01-20 NOTE — Patient Instructions (Signed)

## 2021-03-13 ENCOUNTER — Encounter: Payer: Self-pay | Admitting: Hematology and Oncology

## 2021-04-03 ENCOUNTER — Inpatient Hospital Stay: Payer: 59

## 2021-04-03 ENCOUNTER — Encounter: Payer: Self-pay | Admitting: Hematology and Oncology

## 2021-04-03 ENCOUNTER — Telehealth: Payer: Self-pay | Admitting: Hematology and Oncology

## 2021-04-03 ENCOUNTER — Other Ambulatory Visit: Payer: Self-pay

## 2021-04-03 ENCOUNTER — Inpatient Hospital Stay: Payer: 59 | Attending: Oncology | Admitting: Hematology and Oncology

## 2021-04-03 VITALS — BP 176/73 | HR 82 | Temp 98.2°F | Resp 18 | Ht 65.0 in | Wt 364.0 lb

## 2021-04-03 DIAGNOSIS — D509 Iron deficiency anemia, unspecified: Secondary | ICD-10-CM | POA: Insufficient documentation

## 2021-04-03 DIAGNOSIS — R202 Paresthesia of skin: Secondary | ICD-10-CM

## 2021-04-03 DIAGNOSIS — D500 Iron deficiency anemia secondary to blood loss (chronic): Secondary | ICD-10-CM | POA: Diagnosis not present

## 2021-04-03 DIAGNOSIS — D75838 Other thrombocytosis: Secondary | ICD-10-CM | POA: Insufficient documentation

## 2021-04-03 DIAGNOSIS — N920 Excessive and frequent menstruation with regular cycle: Secondary | ICD-10-CM | POA: Insufficient documentation

## 2021-04-03 LAB — BASIC METABOLIC PANEL
BUN: 14 (ref 4–21)
CO2: 27 — AB (ref 13–22)
Chloride: 103 (ref 99–108)
Creatinine: 0.6 (ref 0.5–1.1)
Glucose: 140
Potassium: 4.1 (ref 3.4–5.3)
Sodium: 137 (ref 137–147)

## 2021-04-03 LAB — HEPATIC FUNCTION PANEL
ALT: 23 (ref 7–35)
AST: 25 (ref 13–35)
Alkaline Phosphatase: 75 (ref 25–125)
Bilirubin, Total: 0.3

## 2021-04-03 LAB — CBC AND DIFFERENTIAL
HCT: 40 (ref 36–46)
Hemoglobin: 13.1 (ref 12.0–16.0)
Neutrophils Absolute: 4.76
Platelets: 333 (ref 150–399)
WBC: 7.8

## 2021-04-03 LAB — IRON AND TIBC
Iron: 48 ug/dL (ref 28–170)
Saturation Ratios: 12 % (ref 10.4–31.8)
TIBC: 398 ug/dL (ref 250–450)
UIBC: 350 ug/dL

## 2021-04-03 LAB — FERRITIN: Ferritin: 16 ng/mL (ref 11–307)

## 2021-04-03 LAB — COMPREHENSIVE METABOLIC PANEL
Albumin: 4.2 (ref 3.5–5.0)
Calcium: 8.8 (ref 8.7–10.7)

## 2021-04-03 LAB — CBC: RBC: 4.49 (ref 3.87–5.11)

## 2021-04-03 NOTE — Progress Notes (Signed)
[facility]  [address] [place],  [place]  [address] [phone]  Clinic Day:  04/07/2021  Referring physician: Marvia Pickles, PA-C   CHIEF COMPLAINT:  CC:   Iron deficiency anemia  Current Treatment:   IV iron replacement as needed   HISTORY OF PRESENT ILLNESS:  Leah Mendoza is a 45 y.o. female with microcytic anemia secondary to iron deficiency with associated reactive thrombocytosis.  This has been an ongoing problem for years, felt to be due to menorrhagia.  She does not tolerate oral iron supplementation, so receives IV iron in the form of Feraheme on a as needed basis.  She has been referred to gynecology in the past, but apparently there are financial issues.  She had an EGD in April 2013 with Dr. Lyda Jester for upper abdominal pain, which did not reveal any abnormality.  She had colonoscopy with Dr. Melina Copa in June 2015, which was also normal.  Routine screening colonoscopy at age 77 was recommended, but now that will changed age 65.  She has received numerous IV iron infusions over the years when she becomes more anemic and symptomatic.  CT abdomen and pelvis in June 2015 revealed endometrial thickening, for which we referred her to [facility] in June 2015, but she never went.  Repeat CT abdomen and pelvis and February 2016 in the emergency department revealed a left adnexal soft tissue density most consistent with an ovarian cyst.  Pelvic ultrasound was recommended and revealed bilateral ovarian cysts.  The endometrium looked normal on the ultrasound.  She was referred back to [facility] again in May 2019, but she did not go.  She received IV Feraheme again in February and March 2021 due to recurrent iron deficiency. Bilateral screening mammogram in July 2021 did not reveal any evidence of malignancy.  She received IV Feraheme again in October.  She had recurrent iron deficiency again in February, so received Feraheme  again at that time.  She reported general body aches prior to receiving Feraheme, so we recommended she see her primary care provider.  INTERVAL HISTORY:  Leah Mendoza is here today for repeat clinical assessment. She states she has been feeling fairly well, except for general body aches, which she reported at her last visit. She also reports tingling and numbness of her extremities now.  She does not have a primary care provider, statin she has been unable to find one accepting new patients.  We gave her a list of local primary care provider's to choose from.  She denies heavy menses. She denies progressive fatigue concerning for recurrent anemia.  She denies fevers or chills. She  denies pain. Her appetite is good. Her weight has increased 6 pounds over last 3 months.  REVIEW OF SYSTEMS:  Review of Systems  Constitutional: Negative for appetite change, chills, fatigue, fever and unexpected weight change.  HENT:   Negative for lump/mass, mouth sores and sore throat.   Respiratory: Negative for cough and shortness of breath.   Cardiovascular: Negative for chest pain and leg swelling.  Gastrointestinal: Negative for abdominal pain, constipation, diarrhea, nausea and vomiting.  Genitourinary: Negative for difficulty urinating, dysuria, frequency and hematuria.   Musculoskeletal: Positive for myalgias. Negative for arthralgias and back pain.  Skin: Negative for rash.  Neurological: Positive for numbness (and tingling of the extremities). Negative for dizziness and headaches.  Hematological: Does not bruise/bleed easily.  Psychiatric/Behavioral: Negative for depression and sleep disturbance. The patient is not nervous/anxious.  VITALS:  Blood pressure (!) 176/73, pulse 82, temperature 98.2 F (36.8 C), temperature source Oral, resp. rate 18, height 5\' 5"  (1.651 m), weight (!) 364 lb (165.1 kg), SpO2 95 %.  Wt Readings from Last 3 Encounters:  04/03/21 (!) 364 lb (165.1 kg)  01/20/21 (!) 357 lb 1.9  oz (162 kg)  01/19/21 (!) 358 lb 1.9 oz (162.4 kg)    Body mass index is 60.57 kg/m.  Performance status (ECOG): 1 - Symptomatic but completely ambulatory  PHYSICAL EXAM:  Physical Exam Vitals and nursing note reviewed.  Constitutional:      General: She is not in acute distress.    Appearance: Normal appearance. She is obese. She is not ill-appearing.  HENT:     Mouth/Throat:     Mouth: Mucous membranes are moist.     Pharynx: Oropharynx is clear. No oropharyngeal exudate or posterior oropharyngeal erythema.  Eyes:     General: No scleral icterus.    Extraocular Movements: Extraocular movements intact.     Conjunctiva/sclera: Conjunctivae normal.     Pupils: Pupils are equal, round, and reactive to light.  Cardiovascular:     Rate and Rhythm: Normal rate and regular rhythm.     Heart sounds: Normal heart sounds. No murmur heard. No friction rub. No gallop.   Pulmonary:     Effort: Pulmonary effort is normal. No respiratory distress.     Breath sounds: Normal breath sounds. No stridor. No wheezing, rhonchi or rales.  Chest:  Breasts:     Right: No axillary adenopathy or supraclavicular adenopathy.     Left: No axillary adenopathy or supraclavicular adenopathy.    Abdominal:     General: There is no distension.     Palpations: Abdomen is soft. There is no hepatomegaly, splenomegaly or mass.     Tenderness: There is no abdominal tenderness. There is no guarding.  Musculoskeletal:        General: Normal range of motion.     Cervical back: Normal range of motion and neck supple. No tenderness.     Right lower leg: No edema.     Left lower leg: No edema.  Lymphadenopathy:     Cervical: No cervical adenopathy.     Upper Body:     Right upper body: No supraclavicular or axillary adenopathy.     Left upper body: No supraclavicular or axillary adenopathy.     Lower Body: No right inguinal adenopathy. No left inguinal adenopathy.  Skin:    General: Skin is warm and dry.      Coloration: Skin is not jaundiced.     Findings: No rash.  Neurological:     Mental Status: She is alert and oriented to person, place, and time.     Cranial Nerves: No cranial nerve deficit.     Sensory: Sensation is intact.     Motor: Motor function is intact.     Coordination: Coordination is intact.  Psychiatric:        Mood and Affect: Mood normal.        Behavior: Behavior normal.        Thought Content: Thought content normal.    LABS:   CBC Latest Ref Rng & Units 04/03/2021 01/04/2021 10/07/2020  WBC - 7.8 8.6 8.3  Hemoglobin 12.0 - 16.0 13.1 12.6 12.9  Hematocrit 36 - 46 40 37 41.3  Platelets 150 - 399 333 438(A) 383   CMP Latest Ref Rng & Units 04/03/2021 01/04/2021  BUN 4 - 21  14 11  Creatinine 0.5 - 1.1 0.6 0.7  Sodium 137 - 147 137 138  Potassium 3.4 - 5.3 4.1 3.9  Chloride 99 - 108 103 102  CO2 13 - 22 27(A) 28(A)  Calcium 8.7 - 10.7 8.8 9.0  Alkaline Phos 25 - 125 75 83  AST 13 - 35 25 35  ALT 7 - 35 23 20     No results found for: CEA1 / No results found for: CEA1 No results found for: PSA1 No results found for: GBT517 No results found for: CAN125  No results found for: Ronnald Ramp, A1GS, A2GS, Arnaldo Natal, GAMS, MSPIKE, SPEI Lab Results  Component Value Date   TIBC 398 04/03/2021   TIBC 420 01/04/2021   TIBC 395 10/07/2020   FERRITIN 16 04/03/2021   FERRITIN 9.0 01/04/2021   FERRITIN 57 10/07/2020   IRONPCTSAT 12 04/03/2021   IRONPCTSAT 9.7 01/04/2021   IRONPCTSAT 22 10/07/2020   No results found for: LDH  STUDIES:  No results found.    HISTORY:   Past Medical History:  Diagnosis Date  . Iron deficiency anemia 08/25/2020  . Iron deficiency anemia secondary to blood loss (chronic)   . Iron deficiency anemia secondary to blood loss (chronic)     History reviewed. No pertinent surgical history.  History reviewed. No pertinent family history.  Social History:  reports that she has never smoked. She has never used  smokeless tobacco. She reports that she does not use drugs. No history on file for alcohol use.The patient is alone today.  Allergies:  Allergies  Allergen Reactions  . Latex     UNKNOWN    Current Medications: Current Outpatient Medications  Medication Sig Dispense Refill  . acetaminophen (TYLENOL) 325 MG tablet Take 650 mg by mouth every 6 (six) hours as needed.     No current facility-administered medications for this visit.     ASSESSMENT & PLAN:   Assessment:  Leah Mendoza is a 45 y.o. female  with a longstanding history of iron deficiency anemia with associated reactive thrombocytosis due to menorrhagia.  Her hemoglobin and platelets and iron studies are normal today.  Plan:  I encouraged her to find a primary care physician as soon as possible to help [place] determine the cause of her generalized pain. We will plan to see her back in 3 months with a CBC, comprehensive metabolic panel and iron studies for repeat clinical assessment. The patient understands the plans discussed today and is in agreement with them.  She knows to contact our office if she develops concerns prior to her next appointment.      Marvia Pickles, PA-C

## 2021-04-03 NOTE — Telephone Encounter (Signed)
Per 5/16 los next appt scheduled and given to patient 

## 2021-06-21 ENCOUNTER — Encounter: Payer: Self-pay | Admitting: Hematology and Oncology

## 2021-06-26 ENCOUNTER — Encounter: Payer: Self-pay | Admitting: *Deleted

## 2021-06-27 ENCOUNTER — Encounter: Payer: Self-pay | Admitting: Hematology and Oncology

## 2021-06-28 ENCOUNTER — Encounter: Payer: Self-pay | Admitting: Hematology and Oncology

## 2021-06-28 ENCOUNTER — Encounter: Payer: Self-pay | Admitting: Diagnostic Neuroimaging

## 2021-06-28 ENCOUNTER — Ambulatory Visit (INDEPENDENT_AMBULATORY_CARE_PROVIDER_SITE_OTHER): Payer: No Typology Code available for payment source | Admitting: Diagnostic Neuroimaging

## 2021-06-28 VITALS — BP 154/82 | HR 73 | Ht 60.0 in | Wt 360.0 lb

## 2021-06-28 DIAGNOSIS — G629 Polyneuropathy, unspecified: Secondary | ICD-10-CM | POA: Diagnosis not present

## 2021-06-28 NOTE — Patient Instructions (Signed)
  NUMBNESS / TINGLING / PAIN (hands and legs; glove and stocking distribution; suspect neuropathy; could be due to borderline diabetes and obesity) - check neuropathy labs - continue gabapentin '300mg'$  at bedtime; may increase up to 300-'600mg'$  three times a day over time - encouraged gradual weight loss

## 2021-06-28 NOTE — Progress Notes (Signed)
[facility]  PATIENT: Leah Mendoza DOB: [birth year]  REFERRING CLINICIAN: Elenore Paddy, NP HISTORY FROM: The patient REASON FOR VISIT: New consult   HISTORICAL  CHIEF COMPLAINT:  Chief Complaint  Patient presents with   Numbness    RM 7 with spouse Kwaku  Pt states she is having numbness in fingers and aches in legs for about a yr.     HISTORY OF PRESENT ILLNESS:   45 year old female here for evaluation of numbness and tingling.  Symptoms started about 1 year ago with numbness, tingling, painful needle sensation in her hands and lower extremities from her thighs down to her toes.  No specific triggering or aggravating factors.  No changes in stress, weight, medications, activity or other factors.  She has been on gabapentin 300 mg as needed with mild relief.  REVIEW OF SYSTEMS: Full 14 system review of systems performed and negative with exception of: As per HPI.  ALLERGIES: Allergies  Allergen Reactions   Latex     UNKNOWN    HOME MEDICATIONS: Outpatient Medications Prior to Visit  Medication Sig Dispense Refill   acetaminophen (TYLENOL) 325 MG tablet Take 650 mg by mouth every 6 (six) hours as needed.     amLODipine (NORVASC) 5 MG tablet Take 5 mg by mouth daily.     gabapentin (NEURONTIN) 300 MG capsule      No facility-administered medications prior to visit.    PAST MEDICAL HISTORY: Past Medical History:  Diagnosis Date   Asthma    Hypertension    Iron deficiency anemia 08/25/2020   Iron deficiency anemia secondary to blood loss (chronic)     PAST SURGICAL HISTORY: Past Surgical History:  Procedure Laterality Date   CHOLECYSTECTOMY      FAMILY HISTORY: Family History  Adopted: Yes    SOCIAL HISTORY: Social History   Socioeconomic History   Marital status: Married    Spouse name: Not on file   Number of children: Not on file   Years of education: Not on file   Highest education level: Not on file   Occupational History   Not on file  Tobacco Use   Smoking status: Never   Smokeless tobacco: Never  Substance and Sexual Activity   Alcohol use: Not on file    Comment: OCCASSIONLY WINE   Drug use: Never   Sexual activity: Not Currently  Other Topics Concern   Not on file  Social History Narrative   Not on file   Social Determinants of Health   Financial Resource Strain: Not on file  Food Insecurity: Not on file  Transportation Needs: Not on file  Physical Activity: Not on file  Stress: Not on file  Social Connections: Not on file  Intimate Partner Violence: Not on file     PHYSICAL EXAM  GENERAL EXAM/CONSTITUTIONAL: Vitals:  Vitals:   06/28/21 0942  BP: (!) 154/82  Pulse: 73  Weight: (!) 360 lb (163.3 kg)  Height: 5' (1.524 m)   Body mass index is 70.31 kg/m. Wt Readings from Last 3 Encounters:  06/28/21 (!) 360 lb (163.3 kg)  04/03/21 (!) 364 lb (165.1 kg)  01/20/21 (!) 357 lb 1.9 oz (162 kg)   Patient is in no distress; well developed, nourished and groomed; neck is supple  CARDIOVASCULAR: Examination of carotid arteries is normal; no carotid bruits Regular rate and rhythm, no murmurs Examination of peripheral vascular system by observation and palpation is normal  EYES: Ophthalmoscopic exam of optic discs  and posterior segments is normal; no papilledema or hemorrhages No results found.  MUSCULOSKELETAL: Gait, strength, tone, movements noted in Neurologic exam below  NEUROLOGIC: MENTAL STATUS:  No flowsheet data found. awake, alert, oriented to person, place and time recent and remote memory intact normal attention and concentration language fluent, comprehension intact, naming intact fund of knowledge appropriate  CRANIAL NERVE:  2nd - no papilledema on fundoscopic exam 2nd, 3rd, 4th, 6th - pupils equal and reactive to light, visual fields full to confrontation, extraocular muscles intact, no nystagmus 5th - facial sensation symmetric 7th  - facial strength symmetric 8th - hearing intact 9th - palate elevates symmetrically, uvula midline 11th - shoulder shrug symmetric 12th - tongue protrusion midline  MOTOR:  normal bulk and tone, full strength in the BUE, BLE  SENSORY:  normal and symmetric to light touch, pinprick, temperature, vibration  COORDINATION:  finger-nose-finger, fine finger movements normal  REFLEXES:  deep tendon reflexes TRACE and symmetric  GAIT/STATION:  narrow based gait     DIAGNOSTIC DATA (LABS, IMAGING, TESTING) - I reviewed patient records, labs, notes, testing and imaging myself where available.  Lab Results  Component Value Date   WBC 7.8 04/03/2021   HGB 13.1 04/03/2021   HCT 40 04/03/2021   MCV 87.7 10/07/2020   PLT 333 04/03/2021      Component Value Date/Time   NA 137 04/03/2021 0000   K 4.1 04/03/2021 0000   CL 103 04/03/2021 0000   CO2 27 (A) 04/03/2021 0000   BUN 14 04/03/2021 0000   CREATININE 0.6 04/03/2021 0000   CALCIUM 8.8 04/03/2021 0000   ALBUMIN 4.2 04/03/2021 0000   AST 25 04/03/2021 0000   ALT 23 04/03/2021 0000   ALKPHOS 75 04/03/2021 0000   No results found for: CHOL, HDL, LDLCALC, LDLDIRECT, TRIG, CHOLHDL No results found for: HGBA1C No results found for: VITAMINB12 No results found for: TSH   04/04/21 LABS  B12 533, a1c 6.1, tsh 2.628    ASSESSMENT AND PLAN  45 y.o. year old female here with:  Dx:  1. Neuropathy       PLAN:  NUMBNESS / TINGLING / PAIN (since 2021; hands and legs; glove and stocking distribution; suspect peripheral neuropathy; could be due to borderline diabetes and obesity) - check neuropathy labs - continue gabapentin '300mg'$  at bedtime; may increase up to 300-'600mg'$  three times a day over time - encouraged gradual weight loss  Orders Placed This Encounter  Procedures   SPEP with IFE   ANA w/Reflex   SSA, SSB   RPR   HIV   ANCA Profile   Return for pending test results, pending if symptoms worsen or fail to  improve, return to PCP.    Penni Bombard, [place] [id number], 0000000 AM Certified in Neurology, Neurophysiology and Neuroimaging  [facility] [address], [place], [place] [address] [phone]

## 2021-07-01 LAB — MULTIPLE MYELOMA PANEL, SERUM
Albumin SerPl Elph-Mcnc: 3.5 g/dL (ref 2.9–4.4)
Albumin/Glob SerPl: 1.1 (ref 0.7–1.7)
Alpha 1: 0.2 g/dL (ref 0.0–0.4)
Alpha2 Glob SerPl Elph-Mcnc: 0.7 g/dL (ref 0.4–1.0)
B-Globulin SerPl Elph-Mcnc: 1.3 g/dL (ref 0.7–1.3)
Gamma Glob SerPl Elph-Mcnc: 1.4 g/dL (ref 0.4–1.8)
Globulin, Total: 3.5 g/dL (ref 2.2–3.9)
IgA/Immunoglobulin A, Serum: 344 mg/dL (ref 87–352)
IgG (Immunoglobin G), Serum: 1255 mg/dL (ref 586–1602)
IgM (Immunoglobulin M), Srm: 172 mg/dL (ref 26–217)
Total Protein: 7 g/dL (ref 6.0–8.5)

## 2021-07-01 LAB — SJOGREN'S SYNDROME ANTIBODS(SSA + SSB)
ENA SSA (RO) Ab: <0.2 AI (ref 0.0–0.9)
ENA SSB (LA) Ab: <0.2 AI (ref 0.0–0.9)

## 2021-07-01 LAB — ANCA PROFILE
Anti-MPO Antibodies: <0.2 units (ref 0.0–0.9)
Anti-PR3 Antibodies: <0.2 units (ref 0.0–0.9)
Atypical pANCA: <1:20 {titer}
C-ANCA: <1:20 {titer}
P-ANCA: <1:20 {titer}

## 2021-07-01 LAB — HIV ANTIBODY (ROUTINE TESTING W REFLEX): HIV Screen 4th Generation wRfx: NONREACTIVE

## 2021-07-01 LAB — RPR: RPR Ser Ql: NONREACTIVE

## 2021-07-04 ENCOUNTER — Inpatient Hospital Stay: Payer: Self-pay | Admitting: Hematology and Oncology

## 2021-07-04 ENCOUNTER — Inpatient Hospital Stay: Payer: Self-pay

## 2021-07-06 LAB — ANA W/REFLEX: ANA Titer 1: NEGATIVE

## 2021-10-25 ENCOUNTER — Encounter: Payer: Self-pay | Admitting: Hematology and Oncology

## 2021-11-22 ENCOUNTER — Other Ambulatory Visit: Payer: Self-pay

## 2021-11-23 ENCOUNTER — Telehealth: Payer: Self-pay

## 2021-11-23 ENCOUNTER — Inpatient Hospital Stay: Payer: BC Managed Care – PPO | Attending: Hematology and Oncology | Admitting: Hematology and Oncology

## 2021-11-23 ENCOUNTER — Encounter: Payer: Self-pay | Admitting: Hematology and Oncology

## 2021-11-23 ENCOUNTER — Inpatient Hospital Stay: Payer: BC Managed Care – PPO

## 2021-11-23 ENCOUNTER — Other Ambulatory Visit: Payer: Self-pay

## 2021-11-23 VITALS — BP 144/83 | HR 85 | Temp 98.6°F | Resp 21 | Ht 60.0 in | Wt 373.0 lb

## 2021-11-23 DIAGNOSIS — E538 Deficiency of other specified B group vitamins: Secondary | ICD-10-CM | POA: Insufficient documentation

## 2021-11-23 DIAGNOSIS — R202 Paresthesia of skin: Secondary | ICD-10-CM

## 2021-11-23 DIAGNOSIS — N920 Excessive and frequent menstruation with regular cycle: Secondary | ICD-10-CM | POA: Insufficient documentation

## 2021-11-23 DIAGNOSIS — D500 Iron deficiency anemia secondary to blood loss (chronic): Secondary | ICD-10-CM

## 2021-11-23 DIAGNOSIS — D509 Iron deficiency anemia, unspecified: Secondary | ICD-10-CM | POA: Diagnosis not present

## 2021-11-23 DIAGNOSIS — Z1231 Encounter for screening mammogram for malignant neoplasm of breast: Secondary | ICD-10-CM | POA: Diagnosis not present

## 2021-11-23 LAB — HCG, SERUM, QUALITATIVE: Preg, Serum: NEGATIVE

## 2021-11-23 LAB — FERRITIN: Ferritin: 3 ng/mL — ABNORMAL LOW (ref 11–307)

## 2021-11-23 LAB — CBC AND DIFFERENTIAL
HCT: 35 — AB (ref 36–46)
Hemoglobin: 11.2 — AB (ref 12.0–16.0)
Neutrophils Absolute: 5.94
Platelets: 422 — AB (ref 150–399)
WBC: 9

## 2021-11-23 LAB — IRON AND TIBC
Iron: 43 ug/dL (ref 28–170)
Saturation Ratios: 9 % — ABNORMAL LOW (ref 10.4–31.8)
TIBC: 470 ug/dL — ABNORMAL HIGH (ref 250–450)
UIBC: 427 ug/dL

## 2021-11-23 LAB — BASIC METABOLIC PANEL
BUN: 13 (ref 4–21)
CO2: 26 — AB (ref 13–22)
Chloride: 103 (ref 99–108)
Creatinine: 0.6 (ref 0.5–1.1)
Glucose: 136
Potassium: 3.9 (ref 3.4–5.3)
Sodium: 134 — AB (ref 137–147)

## 2021-11-23 LAB — CBC
MCV: 77 — AB (ref 81–99)
RBC: 4.48 (ref 3.87–5.11)

## 2021-11-23 LAB — COMPREHENSIVE METABOLIC PANEL
Albumin: 3.9 (ref 3.5–5.0)
Calcium: 8.3 — AB (ref 8.7–10.7)

## 2021-11-23 LAB — FOLATE: Folate: 5.8 ng/mL — ABNORMAL LOW (ref >5.9)

## 2021-11-23 LAB — HEPATIC FUNCTION PANEL
ALT: 18 (ref 7–35)
AST: 19 (ref 13–35)
Alkaline Phosphatase: 78 (ref 25–125)
Bilirubin, Total: 0.5

## 2021-11-23 LAB — VITAMIN B12: Vitamin B-12: 346 pg/mL (ref 180–914)

## 2021-11-23 NOTE — Progress Notes (Signed)
[place]  [address] [place],  [place]  [address] [phone]  Clinic Day:  11/23/2021  Referring physician: [facility] [place] PLAN:   Assessment & Plan: Iron deficiency anemia Leah Mendoza is a 46 y.o. female  with a longstanding history of iron deficiency anemia with associated reactive thrombocytosis due to menorrhagia. She has recurrent mild microcytic anemia. I will scheduled her for IV iron beginning next week pending the iron studies which did reveal recurrent iron deficiency.  We will plan to see her back in 3 months with a CBC, comprehensive panel, iron panel and ferritin.  Folate deficiency The patient's folic acid came back mildly low.  I advised her to start over-the-counter folate acid 800 mcg daily.  We will plan to repeat a folate level at her next visit.   The patient understands the plans discussed today and is in agreement with them.  She knows to contact our office if she develops concerns prior to her next appointment.   Marvia Pickles, PA-C  [facility] AT [facility] [address] [place] [address] Dept: [phone] Dept Fax: [phone]   Orders Placed This Encounter  Procedures   MM DIGITAL SCREENING BILATERAL    Standing Status:   Future    Standing Expiration Date:   11/23/2022    Scheduling Instructions:     RH    Order Specific Question:   Reason for exam:    Answer:   screening    Order Specific Question:   Preferred imaging location?    Answer:   External   HCG, serum qualitative    Standing Status:   Future    Number of Occurrences:   1    Standing Expiration Date:   11/23/2022   CBC and differential    This external order was created through the Results Console.   CBC    This external order was created through the Results Console.   Basic metabolic panel    This external order was created through the Results  Console.   Comprehensive metabolic panel    This external order was created through the Results Console.   Hepatic function panel    This external order was created through the Results Console.   CBC    This order was created through External Result Entry      CHIEF COMPLAINT:  CC: Chronic iron deficiency anemia due to menorrhagia  Current Treatment: IV iron replacement as needed  HISTORY OF PRESENT ILLNESS:  Leah Mendoza is a 46 y.o. female with microcytic anemia secondary to iron deficiency with associated reactive thrombocytosis.  This has been an ongoing problem for years, felt to be due to menorrhagia.  She does not tolerate oral iron supplementation, so receives IV iron on an as needed basis.  She has been referred to gynecology in the past, but apparently there are financial issues.  She had an EGD in April 2013 with Dr. Lyda Jester for upper abdominal pain, which did not reveal any abnormality.  She had colonoscopy with Dr. Melina Copa in June 2015, which was also normal.  Routine screening colonoscopy at age 47 was recommended, but now that will changed age 40, or 74 which would be 10 years from the last.  She has received numerous IV iron infusions over the years when she becomes more anemic and symptomatic.  CT abdomen and pelvis in June 2015 revealed  endometrial thickening, for which we referred her to [facility] in June 2015, but she never went.  Repeat CT abdomen and pelvis and February 2016 in the emergency department revealed a left adnexal soft tissue density most consistent with an ovarian cyst.  Pelvic ultrasound was recommended and revealed bilateral ovarian cysts.  The endometrium looked normal on the ultrasound.  She was referred back to [facility] again in May 2019, but she did not go.   She received IV iron again in February and March 2021 due to recurrent iron deficiency. Bilateral screening mammogram in July 2021 did not reveal any evidence of  malignancy.  She received IV iron in October 2021.  She had recurrent iron deficiency again in February 2022, so received IV iron again again at that time.  She reported general body aches prior to receiving Feraheme, so we recommended she see her primary care provider.  She was last seen in May at which time her hemoglobin and iron studies were normal.  She continued to report general body aches and was encouraged to establish with a primary care provider.    INTERVAL HISTORY:  Leah Mendoza is here today for repeat clinical assessment as she once again has insurance. She was scheduled for follow-up in August, but canceled that appointment due to lack of insurance.  She also has been feeling mildly fatigued and reports of pica to ice, so she is concerned her iron may be getting low.  She continues to report heavy menses usually 5 days a month with clotting.  She denies melena or hematochezia.  She denies fevers or chills. She denies pain. Her appetite is good. Her weight has increased 13 pounds over last 8 months . She has established with [facility] as her primary care.  She was felt to have arthritis causing her generalized pain and was referred to a rheumatologist.  She was given oxycodone/APAP to use as needed for pain.  She is overdue for mammogram, so I will get that scheduled.  Her last colonoscopy in June 2015 with Dr. Melina Copa normal.    REVIEW OF SYSTEMS:  Review of Systems  Constitutional:  Positive for fatigue. Negative for appetite change, chills, fever and unexpected weight change.  HENT:   Negative for lump/mass, mouth sores and sore throat.   Respiratory:  Negative for cough and shortness of breath.   Cardiovascular:  Negative for chest pain and leg swelling.  Gastrointestinal:  Negative for abdominal pain, constipation, diarrhea, nausea and vomiting.  Endocrine: Negative for hot flashes.  Genitourinary:  Negative for difficulty urinating, dysuria, frequency and hematuria.   Musculoskeletal:   Negative for arthralgias, back pain and myalgias.  Skin:  Negative for rash.  Neurological:  Negative for dizziness and headaches.  Hematological:  Negative for adenopathy. Does not bruise/bleed easily.  Psychiatric/Behavioral:  Negative for depression and sleep disturbance. The patient is not nervous/anxious.     VITALS:  Blood pressure (!) 144/83, pulse 85, temperature 98.6 F (37 C), temperature source Oral, resp. rate (!) 21, height 5' (1.524 m), weight (!) 373 lb (169.2 kg), SpO2 96 %.  Wt Readings from Last 3 Encounters:  11/23/21 (!) 373 lb (169.2 kg)  06/28/21 (!) 360 lb (163.3 kg)  04/03/21 (!) 364 lb (165.1 kg)    Body mass index is 72.85 kg/m.  Performance status (ECOG): 1 - Symptomatic but completely ambulatory  PHYSICAL EXAM:  Physical Exam Vitals and nursing note reviewed.  Constitutional:      General: She is not  in acute distress.    Appearance: Normal appearance.  HENT:     Head: Normocephalic and atraumatic.     Mouth/Throat:     Mouth: Mucous membranes are moist.     Pharynx: Oropharynx is clear. No oropharyngeal exudate or posterior oropharyngeal erythema.  Eyes:     General: No scleral icterus.    Extraocular Movements: Extraocular movements intact.     Conjunctiva/sclera: Conjunctivae normal.     Pupils: Pupils are equal, round, and reactive to light.  Cardiovascular:     Rate and Rhythm: Normal rate and regular rhythm.     Heart sounds: Normal heart sounds. No murmur heard.   No friction rub. No gallop.  Pulmonary:     Effort: Pulmonary effort is normal.     Breath sounds: Normal breath sounds. No wheezing, rhonchi or rales.  Abdominal:     General: There is no distension.     Palpations: Abdomen is soft. There is no hepatomegaly, splenomegaly or mass.     Tenderness: There is no abdominal tenderness.  Musculoskeletal:        General: Normal range of motion.     Cervical back: Normal range of motion and neck supple. No tenderness.     Right  lower leg: No edema.     Left lower leg: No edema.  Lymphadenopathy:     Cervical: No cervical adenopathy.     Upper Body:     Right upper body: No supraclavicular or axillary adenopathy.     Left upper body: No supraclavicular or axillary adenopathy.     Lower Body: No right inguinal adenopathy. No left inguinal adenopathy.  Skin:    General: Skin is warm and dry.     Coloration: Skin is not jaundiced.     Findings: No rash.  Neurological:     Mental Status: She is alert and oriented to person, place, and time.     Cranial Nerves: No cranial nerve deficit.  Psychiatric:        Mood and Affect: Mood normal.        Behavior: Behavior normal.        Thought Content: Thought content normal.    LABS:   CBC Latest Ref Rng & Units 11/23/2021 04/03/2021 01/04/2021  WBC - 9.0 7.8 8.6  Hemoglobin 12.0 - 16.0 11.2(A) 13.1 12.6  Hematocrit 36 - 46 35(A) 40 37  Platelets 150 - 399 422(A) 333 438(A)   CMP Latest Ref Rng & Units 11/23/2021 06/28/2021 04/03/2021  BUN 4 - 21 13 - 14  Creatinine 0.5 - 1.1 0.6 - 0.6  Sodium 137 - 147 134(A) - 137  Potassium 3.4 - 5.3 3.9 - 4.1  Chloride 99 - 108 103 - 103  CO2 13 - 22 26(A) - 27(A)  Calcium 8.7 - 10.7 8.3(A) - 8.8  Total Protein 6.0 - 8.5 g/dL - 7.0 -  Alkaline Phos 25 - 125 78 - 75  AST 13 - 35 19 - 25  ALT 7 - 35 18 - 23     No results found for: CEA1 / No results found for: CEA1 No results found for: PSA1 No results found for: RKY706 No results found for: CAN125  No results found for: TOTALPROTELP, ALBUMINELP, A1GS, A2GS, BETS, BETA2SER, GAMS, MSPIKE, SPEI Lab Results  Component Value Date   TIBC 470 (H) 11/23/2021   TIBC 398 04/03/2021   TIBC 420 01/04/2021   FERRITIN 3 (L) 11/23/2021   FERRITIN 16 04/03/2021   FERRITIN  9.0 01/04/2021   IRONPCTSAT 9 (L) 11/23/2021   IRONPCTSAT 12 04/03/2021   IRONPCTSAT 9.7 01/04/2021   No results found for: LDH  STUDIES:  No results found.    HISTORY:   Past Medical History:   Diagnosis Date   Asthma    Folate deficiency 11/24/2021   Hypertension    Iron deficiency anemia 08/25/2020   Iron deficiency anemia secondary to blood loss (chronic)     Past Surgical History:  Procedure Laterality Date   CHOLECYSTECTOMY      Family History  Adopted: Yes    Social History:  reports that she has never smoked. She has never used smokeless tobacco. She reports that she does not use drugs. No history on file for alcohol use.The patient is accompanied by her husband today.  Allergies:  Allergies  Allergen Reactions   Latex     UNKNOWN    Current Medications: Current Outpatient Medications  Medication Sig Dispense Refill   amLODipine (NORVASC) 5 MG tablet Take 5 mg by mouth daily.     gabapentin (NEURONTIN) 300 MG capsule      Vitamin D, Ergocalciferol, (DRISDOL) 1.25 MG (50000 UNIT) CAPS capsule Take 50,000 Units by mouth once a week.     No current facility-administered medications for this visit.

## 2021-11-23 NOTE — Assessment & Plan Note (Addendum)
Leah Mendoza is a 46 y.o. female  with a longstanding history of iron deficiency anemia with associated reactive thrombocytosis due to menorrhagia.She has recurrent mild microcytic anemia. I will scheduled her for IV iron beginning next week pending the iron studies which did reveal recurrent iron deficiency.  We will plan to see her back in 3 months with a CBC, comprehensive panel, iron panel and ferritin.

## 2021-11-23 NOTE — Telephone Encounter (Signed)
Notified that her folic acid was low as well as her iron. Please have her start over the counter folic acid 510 mcg daily

## 2021-11-23 NOTE — Telephone Encounter (Signed)
-----   Message from Marvia Pickles, PA-C sent at 11/23/2021  3:24 PM EST ----- Please let her know her folic acid was low as well as her iron. Please have her start over the counter folic acid 791 mcg daily. Thank you

## 2021-11-24 ENCOUNTER — Encounter: Payer: Self-pay | Admitting: Hematology and Oncology

## 2021-11-24 DIAGNOSIS — E538 Deficiency of other specified B group vitamins: Secondary | ICD-10-CM | POA: Insufficient documentation

## 2021-11-24 HISTORY — DX: Deficiency of other specified B group vitamins: E53.8

## 2021-11-24 NOTE — Assessment & Plan Note (Signed)
The patient's folic acid came back mildly low.  I advised her to start over-the-counter folate acid 800 mcg daily.  We will plan to repeat a folate level at her next visit.

## 2021-11-27 ENCOUNTER — Other Ambulatory Visit: Payer: Self-pay | Admitting: Pharmacist

## 2021-12-01 MED FILL — Iron Sucrose Inj 20 MG/ML (Fe Equiv): INTRAVENOUS | Qty: 10 | Status: AC

## 2021-12-04 ENCOUNTER — Inpatient Hospital Stay: Payer: BC Managed Care – PPO

## 2021-12-04 ENCOUNTER — Other Ambulatory Visit: Payer: Self-pay

## 2021-12-04 VITALS — BP 162/74 | HR 80 | Temp 98.2°F | Resp 20 | Ht 60.0 in | Wt 373.0 lb

## 2021-12-04 DIAGNOSIS — N920 Excessive and frequent menstruation with regular cycle: Secondary | ICD-10-CM | POA: Diagnosis not present

## 2021-12-04 DIAGNOSIS — D509 Iron deficiency anemia, unspecified: Secondary | ICD-10-CM | POA: Diagnosis not present

## 2021-12-04 DIAGNOSIS — E538 Deficiency of other specified B group vitamins: Secondary | ICD-10-CM | POA: Diagnosis not present

## 2021-12-04 DIAGNOSIS — D500 Iron deficiency anemia secondary to blood loss (chronic): Secondary | ICD-10-CM

## 2021-12-04 MED ORDER — SODIUM CHLORIDE 0.9 % IV SOLN: Freq: Once | INTRAVENOUS | Status: AC

## 2021-12-04 MED ORDER — SODIUM CHLORIDE 0.9 % IV SOLN
200.0000 mg | Freq: Once | INTRAVENOUS | Status: AC
  Administered 2021-12-04: 200 mg via INTRAVENOUS
  Filled 2021-12-04: qty 200

## 2021-12-04 NOTE — Patient Instructions (Signed)

## 2021-12-05 MED FILL — Iron Sucrose Inj 20 MG/ML (Fe Equiv): INTRAVENOUS | Qty: 10 | Status: AC

## 2021-12-06 ENCOUNTER — Inpatient Hospital Stay: Payer: BC Managed Care – PPO

## 2021-12-06 ENCOUNTER — Other Ambulatory Visit: Payer: Self-pay

## 2021-12-06 VITALS — BP 138/74 | HR 88 | Temp 98.1°F | Resp 18 | Ht 60.0 in | Wt 372.0 lb

## 2021-12-06 DIAGNOSIS — N920 Excessive and frequent menstruation with regular cycle: Secondary | ICD-10-CM | POA: Diagnosis not present

## 2021-12-06 DIAGNOSIS — D509 Iron deficiency anemia, unspecified: Secondary | ICD-10-CM | POA: Diagnosis not present

## 2021-12-06 DIAGNOSIS — E538 Deficiency of other specified B group vitamins: Secondary | ICD-10-CM | POA: Diagnosis not present

## 2021-12-06 DIAGNOSIS — D500 Iron deficiency anemia secondary to blood loss (chronic): Secondary | ICD-10-CM

## 2021-12-06 MED ORDER — SODIUM CHLORIDE 0.9 % IV SOLN
200.0000 mg | Freq: Once | INTRAVENOUS | Status: AC
  Administered 2021-12-06: 200 mg via INTRAVENOUS
  Filled 2021-12-06: qty 200

## 2021-12-06 MED ORDER — SODIUM CHLORIDE 0.9 % IV SOLN: Freq: Once | INTRAVENOUS | Status: AC

## 2021-12-06 MED FILL — Iron Sucrose Inj 20 MG/ML (Fe Equiv): INTRAVENOUS | Qty: 10 | Status: AC

## 2021-12-06 NOTE — Patient Instructions (Signed)

## 2021-12-07 ENCOUNTER — Inpatient Hospital Stay: Payer: BC Managed Care – PPO

## 2021-12-07 VITALS — BP 141/74 | HR 79 | Temp 98.1°F | Resp 18 | Ht 60.0 in | Wt 375.0 lb

## 2021-12-07 DIAGNOSIS — D509 Iron deficiency anemia, unspecified: Secondary | ICD-10-CM | POA: Diagnosis not present

## 2021-12-07 DIAGNOSIS — D500 Iron deficiency anemia secondary to blood loss (chronic): Secondary | ICD-10-CM

## 2021-12-07 DIAGNOSIS — N920 Excessive and frequent menstruation with regular cycle: Secondary | ICD-10-CM | POA: Diagnosis not present

## 2021-12-07 DIAGNOSIS — E538 Deficiency of other specified B group vitamins: Secondary | ICD-10-CM | POA: Diagnosis not present

## 2021-12-07 MED ORDER — SODIUM CHLORIDE 0.9 % IV SOLN: Freq: Once | INTRAVENOUS | Status: AC

## 2021-12-07 MED ORDER — SODIUM CHLORIDE 0.9 % IV SOLN
200.0000 mg | Freq: Once | INTRAVENOUS | Status: AC
  Administered 2021-12-07: 200 mg via INTRAVENOUS
  Filled 2021-12-07: qty 200

## 2021-12-07 MED FILL — Iron Sucrose Inj 20 MG/ML (Fe Equiv): INTRAVENOUS | Qty: 10 | Status: AC

## 2021-12-07 NOTE — Patient Instructions (Signed)

## 2021-12-08 ENCOUNTER — Other Ambulatory Visit: Payer: Self-pay

## 2021-12-08 ENCOUNTER — Inpatient Hospital Stay: Payer: BC Managed Care – PPO

## 2021-12-08 VITALS — BP 140/65 | HR 77 | Temp 98.1°F | Resp 18 | Ht 60.0 in | Wt 374.0 lb

## 2021-12-08 DIAGNOSIS — D509 Iron deficiency anemia, unspecified: Secondary | ICD-10-CM | POA: Diagnosis not present

## 2021-12-08 DIAGNOSIS — E538 Deficiency of other specified B group vitamins: Secondary | ICD-10-CM | POA: Diagnosis not present

## 2021-12-08 DIAGNOSIS — Z1231 Encounter for screening mammogram for malignant neoplasm of breast: Secondary | ICD-10-CM | POA: Diagnosis not present

## 2021-12-08 DIAGNOSIS — D500 Iron deficiency anemia secondary to blood loss (chronic): Secondary | ICD-10-CM

## 2021-12-08 DIAGNOSIS — N920 Excessive and frequent menstruation with regular cycle: Secondary | ICD-10-CM | POA: Diagnosis not present

## 2021-12-08 MED ORDER — SODIUM CHLORIDE 0.9 % IV SOLN: Freq: Once | INTRAVENOUS | Status: AC

## 2021-12-08 MED ORDER — SODIUM CHLORIDE 0.9 % IV SOLN
200.0000 mg | Freq: Once | INTRAVENOUS | Status: AC
  Administered 2021-12-08: 200 mg via INTRAVENOUS
  Filled 2021-12-08: qty 200

## 2021-12-08 MED FILL — Iron Sucrose Inj 20 MG/ML (Fe Equiv): INTRAVENOUS | Qty: 10 | Status: AC

## 2021-12-08 NOTE — Patient Instructions (Signed)

## 2021-12-11 ENCOUNTER — Other Ambulatory Visit: Payer: Self-pay

## 2021-12-11 ENCOUNTER — Inpatient Hospital Stay: Payer: BC Managed Care – PPO

## 2021-12-11 VITALS — BP 131/74 | HR 78 | Temp 98.7°F | Resp 18 | Ht 60.0 in | Wt 375.0 lb

## 2021-12-11 DIAGNOSIS — E538 Deficiency of other specified B group vitamins: Secondary | ICD-10-CM | POA: Diagnosis not present

## 2021-12-11 DIAGNOSIS — D509 Iron deficiency anemia, unspecified: Secondary | ICD-10-CM | POA: Diagnosis not present

## 2021-12-11 DIAGNOSIS — N920 Excessive and frequent menstruation with regular cycle: Secondary | ICD-10-CM | POA: Diagnosis not present

## 2021-12-11 DIAGNOSIS — D500 Iron deficiency anemia secondary to blood loss (chronic): Secondary | ICD-10-CM

## 2021-12-11 MED ORDER — SODIUM CHLORIDE 0.9 % IV SOLN
200.0000 mg | Freq: Once | INTRAVENOUS | Status: AC
  Administered 2021-12-11: 200 mg via INTRAVENOUS
  Filled 2021-12-11: qty 200

## 2021-12-11 MED ORDER — SODIUM CHLORIDE 0.9 % IV SOLN: Freq: Once | INTRAVENOUS | Status: AC

## 2021-12-11 NOTE — Patient Instructions (Signed)

## 2022-01-30 ENCOUNTER — Encounter: Payer: Self-pay | Admitting: Hematology and Oncology

## 2022-02-20 NOTE — Progress Notes (Signed)
?[facility]  ?[address] ?[place],  [place]  [address] ?[phone] ? ?Clinic Day:  02/21/2022 ? ?Referring physician: [facility] [place]:  ? ?Iron deficiency anemia ?Leah Mendoza is a 46 y.o. female  with a longstanding history of iron deficiency anemia with associated reactive thrombocytosis due to menorrhagia.  She is not anemic today but her hemoglobin is only 12.1 and her platelet count is 400,000 with an MCV of 84.  Her last IV iron was in February.  I do think she will need iron again by the time she returns. ? ?Folate deficiency ?The patient's folic acid came back mildly low and she was advised her to start over-the-counter folate acid 800 mcg daily.  We will plan to repeat a folate level at her next visit.  ? ?Her menstrual period was late last month but she does not feel she is pregnant.  Her last IV iron was Venofer in February 2023.  I will plan to see her back in 3 to 4 months with CBC, comprehensive metabolic profile, U04, folate, iron, TIBC and ferritin levels.  Most likely she will require IV iron again by then.  The patient understands the plans discussed today and is in agreement with them.  She knows to contact our office if she develops concerns prior to her next appointment. ? ? ?Derwood Kaplan, MD  ?[facility] ?[place] ?[place] [address] ?Dept: [phone] ?Dept Fax: [phone]  ? ?Orders Placed This Encounter  ?Procedures  ? Iron and TIBC  ?  ? ? ?CHIEF COMPLAINT:  ?CC: Chronic iron deficiency anemia due to menorrhagia ? ?Current Treatment: IV iron replacement as needed ? ?HISTORY OF PRESENT ILLNESS:  ?Leah Mendoza is a 46 y.o. female with microcytic anemia secondary to iron deficiency with associated reactive thrombocytosis.  This has been an ongoing problem for years, felt to be due to menorrhagia.  She does not  tolerate oral iron supplementation, so receives IV iron on an as needed basis.  She has been referred to gynecology in the past, but apparently there are financial issues.  She had an EGD in April 2013 with Dr. Lyda Jester for upper abdominal pain, which did not reveal any abnormality.  She had colonoscopy with Dr. Melina Copa in June 2015, which was also normal.  Routine screening colonoscopy at age 63 was recommended, but now that will changed age 39, or 72 which would be 10 years from the last.  She has received numerous IV iron infusions over the years when she becomes more anemic and symptomatic.  CT abdomen and pelvis in June 2015 revealed endometrial thickening, for which we referred her to [facility] in June 2015, but she never went.  Repeat CT abdomen and pelvis and February 2016 in the emergency department revealed a left adnexal soft tissue density most consistent with an ovarian cyst.  Pelvic ultrasound was recommended and revealed bilateral ovarian cysts.  The endometrium looked normal on the ultrasound.  She received IV iron again in February and March 2021 due to recurrent iron deficiency. Bilateral screening mammogram in July 2021 did not reveal any evidence of malignancy.  She received IV iron in October 2021.  She had recurrent iron deficiency again in February 2022, so received IV iron again again at that time.  She reported general body aches prior to receiving Feraheme, so we recommended she see her  primary care provider.  She was last seen in May at which time her hemoglobin and iron studies were normal.  She continued to report general body aches and was encouraged to establish with a primary care provider.   ? ?INTERVAL HISTORY:  ?Fate is here today for repeat clinical assessment of her chronic iron deficiency.  She was seen in February 2023 and scheduled for IV iron and follow-up in August, but canceled that appointment.  She feels fairly well.  She continues to report heavy menses  usually 5 days a month with clotting.  She did finally have her mammogram in January and this was clear.  She denies melena or hematochezia.  She denies fevers or chills. She denies pain. Her appetite is good. Her weight has increased 6 pounds over last 3 months .  Her last colonoscopy in June 2015 with Dr. Melina Copa was normal.   ? ?REVIEW OF SYSTEMS:  ?Review of Systems  ?Constitutional:  Negative for appetite change, chills, fever and unexpected weight change.  ?HENT:   Negative for lump/mass, mouth sores and sore throat.   ?Respiratory:  Negative for cough and shortness of breath.   ?Cardiovascular:  Negative for chest pain and leg swelling.  ?Gastrointestinal:  Negative for abdominal pain, constipation, diarrhea, nausea and vomiting.  ?Endocrine: Negative for hot flashes.  ?Genitourinary:  Negative for difficulty urinating, dysuria, frequency and hematuria.   ?Musculoskeletal:  Negative for arthralgias, back pain and myalgias.  ?Skin:  Negative for rash.  ?Neurological:  Negative for dizziness and headaches.  ?Hematological:  Negative for adenopathy. Does not bruise/bleed easily.  ?Psychiatric/Behavioral:  Negative for depression and sleep disturbance. The patient is not nervous/anxious.    ? ?VITALS:  ?Blood pressure (!) 183/81, pulse 85, temperature 98 ?F (36.7 ?C), temperature source Oral, resp. rate 20, height 5' (1.524 m), weight (!) 381 lb 1.6 oz (172.9 kg), SpO2 94 %.  ?Wt Readings from Last 3 Encounters:  ?02/21/22 (!) 381 lb 1.6 oz (172.9 kg)  ?12/11/21 (!) 375 lb (170.1 kg)  ?12/08/21 (!) 374 lb (169.6 kg)  ?  ?Body mass index is 74.43 kg/m?. ? ?Performance status (ECOG): 1 - Symptomatic but completely ambulatory ? ?PHYSICAL EXAM:  ?Physical Exam ?Vitals and nursing note reviewed.  ?Constitutional:   ?   General: She is not in acute distress. ?   Appearance: Normal appearance.  ?HENT:  ?   Head: Normocephalic and atraumatic.  ?   Mouth/Throat:  ?   Mouth: Mucous membranes are moist.  ?   Pharynx:  Oropharynx is clear. No oropharyngeal exudate or posterior oropharyngeal erythema.  ?Eyes:  ?   General: No scleral icterus. ?   Extraocular Movements: Extraocular movements intact.  ?   Conjunctiva/sclera: Conjunctivae normal.  ?   Pupils: Pupils are equal, round, and reactive to light.  ?Cardiovascular:  ?   Rate and Rhythm: Normal rate and regular rhythm.  ?   Heart sounds: Normal heart sounds. No murmur heard. ?  No friction rub. No gallop.  ?Pulmonary:  ?   Effort: Pulmonary effort is normal.  ?   Breath sounds: Normal breath sounds. No wheezing, rhonchi or rales.  ?Abdominal:  ?   General: There is no distension.  ?   Palpations: Abdomen is soft. There is no hepatomegaly, splenomegaly or mass.  ?   Tenderness: There is no abdominal tenderness.  ?Musculoskeletal:     ?   General: Normal range of motion.  ?   Cervical back: Normal range of  motion and neck supple. No tenderness.  ?   Right lower leg: No edema.  ?   Left lower leg: No edema.  ?Lymphadenopathy:  ?   Cervical: No cervical adenopathy.  ?   Upper Body:  ?   Right upper body: No supraclavicular or axillary adenopathy.  ?   Left upper body: No supraclavicular or axillary adenopathy.  ?   Lower Body: No right inguinal adenopathy. No left inguinal adenopathy.  ?Skin: ?   General: Skin is warm and dry.  ?   Coloration: Skin is not jaundiced.  ?   Findings: No rash.  ?Neurological:  ?   Mental Status: She is alert and oriented to person, place, and time.  ?   Cranial Nerves: No cranial nerve deficit.  ?Psychiatric:     ?   Mood and Affect: Mood normal.     ?   Behavior: Behavior normal.     ?   Thought Content: Thought content normal.  ? ? ?LABS:  ? ? ?  Latest Ref Rng & Units 02/21/2022  ? 12:00 AM 11/23/2021  ? 12:00 AM 04/03/2021  ? 12:00 AM  ?CBC  ?WBC  9.3      9.0      7.8    ?Hemoglobin 12.0 - 16.0 12.1      11.2      13.1    ?Hematocrit 36 - 46 38      35      40    ?Platelets 150 - 400 K/uL 400      422      333    ?  ? This result is from an external  source.  ? ? ?  Latest Ref Rng & Units 02/21/2022  ? 12:00 AM 11/23/2021  ? 12:00 AM 06/28/2021  ?  1:31 PM  ?CMP  ?BUN 4 - '21 16      13        '$ ?Creatinine 0.5 - 1.1 0.8      0.6        ?Sodium 137 - 147 138      134

## 2022-02-21 ENCOUNTER — Inpatient Hospital Stay: Payer: BC Managed Care – PPO | Attending: Hematology and Oncology | Admitting: Oncology

## 2022-02-21 ENCOUNTER — Other Ambulatory Visit: Payer: Self-pay | Admitting: Oncology

## 2022-02-21 ENCOUNTER — Encounter: Payer: Self-pay | Admitting: Oncology

## 2022-02-21 ENCOUNTER — Inpatient Hospital Stay: Payer: BC Managed Care – PPO

## 2022-02-21 VITALS — BP 183/81 | HR 85 | Temp 98.0°F | Resp 20 | Ht 60.0 in | Wt 381.1 lb

## 2022-02-21 DIAGNOSIS — N920 Excessive and frequent menstruation with regular cycle: Secondary | ICD-10-CM | POA: Insufficient documentation

## 2022-02-21 DIAGNOSIS — E538 Deficiency of other specified B group vitamins: Secondary | ICD-10-CM

## 2022-02-21 DIAGNOSIS — D500 Iron deficiency anemia secondary to blood loss (chronic): Secondary | ICD-10-CM

## 2022-02-21 LAB — HEPATIC FUNCTION PANEL
ALT: 28 U/L (ref 7–35)
AST: 31 (ref 13–35)
Alkaline Phosphatase: 78 (ref 25–125)
Bilirubin, Total: 0.5

## 2022-02-21 LAB — BASIC METABOLIC PANEL
BUN: 16 (ref 4–21)
CO2: 30 — AB (ref 13–22)
Chloride: 102 (ref 99–108)
Creatinine: 0.8 (ref 0.5–1.1)
Glucose: 142
Potassium: 4.1 mEq/L (ref 3.5–5.1)
Sodium: 138 (ref 137–147)

## 2022-02-21 LAB — IRON AND TIBC
Iron: 47 ug/dL (ref 28–170)
Saturation Ratios: 11 % (ref 10.4–31.8)
TIBC: 411 ug/dL (ref 250–450)
UIBC: 364 ug/dL

## 2022-02-21 LAB — COMPREHENSIVE METABOLIC PANEL
Albumin: 4.1 (ref 3.5–5.0)
Calcium: 8.9 (ref 8.7–10.7)

## 2022-02-21 LAB — CBC AND DIFFERENTIAL
HCT: 38 (ref 36–46)
Hemoglobin: 12.1 (ref 12.0–16.0)
Neutrophils Absolute: 5.58
Platelets: 400 10*3/uL (ref 150–400)
WBC: 9.3

## 2022-02-21 LAB — CBC: RBC: 4.53 (ref 3.87–5.11)

## 2022-02-21 LAB — FOLATE: Folate: 6.3 ng/mL (ref >5.9)

## 2022-02-21 LAB — FERRITIN: Ferritin: 6 ng/mL — ABNORMAL LOW (ref 11–307)

## 2022-03-04 ENCOUNTER — Encounter: Payer: Self-pay | Admitting: Hematology and Oncology

## 2022-05-07 ENCOUNTER — Encounter: Payer: Self-pay | Admitting: Oncology

## 2022-06-06 ENCOUNTER — Encounter: Payer: Self-pay | Admitting: Oncology

## 2022-06-06 ENCOUNTER — Inpatient Hospital Stay: Payer: BC Managed Care – PPO

## 2022-06-06 ENCOUNTER — Inpatient Hospital Stay: Payer: BC Managed Care – PPO | Attending: Oncology | Admitting: Oncology

## 2022-06-06 ENCOUNTER — Telehealth: Payer: Self-pay | Admitting: Oncology

## 2022-06-06 ENCOUNTER — Other Ambulatory Visit: Payer: Self-pay | Admitting: Oncology

## 2022-06-06 VITALS — BP 147/82 | HR 81 | Temp 97.9°F | Resp 18 | Ht 60.0 in | Wt 390.3 lb

## 2022-06-06 DIAGNOSIS — D500 Iron deficiency anemia secondary to blood loss (chronic): Secondary | ICD-10-CM

## 2022-06-06 DIAGNOSIS — E538 Deficiency of other specified B group vitamins: Secondary | ICD-10-CM | POA: Diagnosis not present

## 2022-06-06 DIAGNOSIS — D509 Iron deficiency anemia, unspecified: Secondary | ICD-10-CM | POA: Insufficient documentation

## 2022-06-06 LAB — CBC: RBC: 4.36 (ref 3.87–5.11)

## 2022-06-06 LAB — BASIC METABOLIC PANEL
BUN: 9 (ref 4–21)
CO2: 29 — AB (ref 13–22)
Chloride: 101 (ref 99–108)
Creatinine: 0.7 (ref 0.5–1.1)
Glucose: 116
Potassium: 3.8 mEq/L (ref 3.5–5.1)
Sodium: 137 (ref 137–147)

## 2022-06-06 LAB — CBC AND DIFFERENTIAL
HCT: 37 (ref 36–46)
Hemoglobin: 11.5 — AB (ref 12.0–16.0)
Neutrophils Absolute: 6.53
Platelets: 463 10*3/uL — AB (ref 150–400)
WBC: 9.9

## 2022-06-06 LAB — IRON AND TIBC
Iron: 40 ug/dL (ref 28–170)
Saturation Ratios: 9 % — ABNORMAL LOW (ref 10.4–31.8)
TIBC: 440 ug/dL (ref 250–450)
UIBC: 400 ug/dL

## 2022-06-06 LAB — HEPATIC FUNCTION PANEL
ALT: 23 U/L (ref 7–35)
AST: 28 (ref 13–35)
Alkaline Phosphatase: 74 (ref 25–125)
Bilirubin, Total: 0.6

## 2022-06-06 LAB — FERRITIN: Ferritin: 3 ng/mL — ABNORMAL LOW (ref 11–307)

## 2022-06-06 LAB — COMPREHENSIVE METABOLIC PANEL
Albumin: 4 (ref 3.5–5.0)
Calcium: 8.7 (ref 8.7–10.7)

## 2022-06-06 LAB — FOLATE: Folate: 6.1 ng/mL (ref >5.9)

## 2022-06-06 LAB — VITAMIN B12: Vitamin B-12: 430 pg/mL (ref 180–914)

## 2022-06-06 NOTE — Telephone Encounter (Signed)
Per 06/06/22 los next appt scheduled and confirmed with patient 

## 2022-06-07 ENCOUNTER — Encounter: Payer: Self-pay | Admitting: Oncology

## 2022-06-07 NOTE — Progress Notes (Signed)
[place]  [address] [place],  [place]  [id number] [phone]  Clinic Day:  06/06/22  Referring physician: [facility] [place] PLAN:   Iron deficiency anemia Leah Mendoza is a 46 y.o. female  with a longstanding history of iron deficiency anemia with associated reactive thrombocytosis due to menorrhagia.  Her last IV iron was in February.  At her last visit her iron levels were quite low but her hemoglobin was still normal.  It has now dropped to 11.5 and she is quite symptomatic.  I do think she needs iron again.    Folate deficiency The patient's folic acid came back mildly low and she was advised her to start over-the-counter folate acid 800 mcg daily.  We will plan to repeat a folate level at her next visit.   Her last IV iron was Venofer in February 2023.  She is fairly symptomatic and her hemoglobin is dropping.  She is clearly iron deficient again and so we will proceed with IV Venofer.  I will plan to see her back in 6 months with CBC, comprehensive metabolic profile, iron, TIBC and ferritin levels.  The patient understands the plans discussed today and is in agreement with them.  She knows to contact our office if she develops concerns prior to her next appointment.   Derwood Kaplan, MD  [facility] AT [facility] [address] [place] [address] Dept: [phone] Dept Fax: [phone]   Orders Placed This Encounter  Procedures   CBC and differential    This external order was created through the Results Console.   CBC    This external order was created through the Results Console.   Basic metabolic panel    This external order was created through the Results Console.   Comprehensive metabolic panel    This external order was created through the Results Console.   Hepatic function panel    This external order was created through the  Results Console.      CHIEF COMPLAINT:  CC: Chronic iron deficiency anemia due to menorrhagia  Current Treatment: IV iron replacement as needed  HISTORY OF PRESENT ILLNESS:  Leah Mendoza is a 46 y.o. female with microcytic anemia secondary to iron deficiency with associated reactive thrombocytosis.  This has been an ongoing problem for years, felt to be due to menorrhagia.  She does not tolerate oral iron supplementation, so receives IV iron on an as needed basis.  She has been referred to gynecology in the past, but apparently there are financial issues.  She had an EGD in April 2013 with Dr. Lyda Jester for upper abdominal pain, which did not reveal any abnormality.  She had colonoscopy with Dr. Melina Copa in June 2015, which was also normal.  Routine screening colonoscopy at age 80 was recommended, but now that will changed age 81, or 73 which would be 10 years from the last.  She has received numerous IV iron infusions over the years when she becomes more anemic and symptomatic.  CT abdomen and pelvis in June 2015 revealed endometrial thickening, for which we referred her to [facility] in June 2015, but she never went.  Repeat CT abdomen and pelvis and February 2016 in the emergency department revealed a left adnexal soft tissue density most consistent with an ovarian cyst.  Pelvic ultrasound was recommended and revealed bilateral ovarian cysts.  The endometrium looked normal on  the ultrasound.  She received IV iron again in February and March 2021 due to recurrent iron deficiency. Bilateral screening mammogram in July 2021 did not reveal any evidence of malignancy.  She received IV iron in October 2021.  She had recurrent iron deficiency again in February 2022.  INTERVAL HISTORY:  Leah Mendoza is here today for repeat clinical assessment of her chronic iron deficiency.  She was seen in February 2023 and scheduled for IV iron.  Labs from April clearly show significant iron deficiency but  her hemoglobin was 12.1 at that time.  She now feels constant fatigue, more than usual.  Her hemoglobin has dropped to 11.5 and her platelet count is up to 463,000.  CMP is normal.  She continues to report heavy menses usually 5 days a month with clotting.  She did finally have her mammogram in January and this was clear.  She denies melena or hematochezia.  She denies fevers or chills. She denies pain. Her appetite is good. Her weight has increased 9 pounds over last 3 months .  Her last colonoscopy in June 2015 with Dr. Melina Copa was normal.    REVIEW OF SYSTEMS:  Review of Systems  Constitutional:  Positive for fatigue. Negative for appetite change, chills, fever and unexpected weight change.  HENT:   Negative for lump/mass, mouth sores and sore throat.   Respiratory:  Negative for cough and shortness of breath.   Cardiovascular:  Negative for chest pain and leg swelling.  Gastrointestinal:  Negative for abdominal pain, constipation, diarrhea, nausea and vomiting.  Endocrine: Negative for hot flashes.  Genitourinary:  Negative for difficulty urinating, dysuria, frequency and hematuria.   Musculoskeletal:  Negative for arthralgias, back pain and myalgias.  Skin:  Negative for rash.  Neurological:  Negative for dizziness and headaches.  Hematological:  Negative for adenopathy. Does not bruise/bleed easily.  Psychiatric/Behavioral:  Negative for depression and sleep disturbance. The patient is not nervous/anxious.     VITALS:  Blood pressure (!) 147/82, pulse 81, temperature 97.9 F (36.6 C), temperature source Oral, resp. rate 18, height 5' (1.524 m), weight (!) 390 lb 4.8 oz (177 kg), SpO2 99 %.  Wt Readings from Last 3 Encounters:  06/06/22 (!) 390 lb 4.8 oz (177 kg)  02/21/22 (!) 381 lb 1.6 oz (172.9 kg)  12/11/21 (!) 375 lb (170.1 kg)    Body mass index is 76.23 kg/m.  Performance status (ECOG): 1 - Symptomatic but completely ambulatory  PHYSICAL EXAM:  Physical Exam Vitals and  nursing note reviewed.  Constitutional:      General: She is not in acute distress.    Appearance: Normal appearance.  HENT:     Head: Normocephalic and atraumatic.     Mouth/Throat:     Mouth: Mucous membranes are moist.     Pharynx: Oropharynx is clear. No oropharyngeal exudate or posterior oropharyngeal erythema.  Eyes:     General: No scleral icterus.    Extraocular Movements: Extraocular movements intact.     Conjunctiva/sclera: Conjunctivae normal.     Pupils: Pupils are equal, round, and reactive to light.  Cardiovascular:     Rate and Rhythm: Normal rate and regular rhythm.     Heart sounds: Normal heart sounds. No murmur heard.    No friction rub. No gallop.  Pulmonary:     Effort: Pulmonary effort is normal.     Breath sounds: Normal breath sounds. No wheezing, rhonchi or rales.  Abdominal:     General: There is no distension.  Palpations: Abdomen is soft. There is no hepatomegaly, splenomegaly or mass.     Tenderness: There is no abdominal tenderness.  Musculoskeletal:        General: Normal range of motion.     Cervical back: Normal range of motion and neck supple. No tenderness.     Right lower leg: No edema.     Left lower leg: No edema.  Lymphadenopathy:     Cervical: No cervical adenopathy.     Upper Body:     Right upper body: No supraclavicular or axillary adenopathy.     Left upper body: No supraclavicular or axillary adenopathy.     Lower Body: No right inguinal adenopathy. No left inguinal adenopathy.  Skin:    General: Skin is warm and dry.     Coloration: Skin is not jaundiced.     Findings: No rash.  Neurological:     Mental Status: She is alert and oriented to person, place, and time.     Cranial Nerves: No cranial nerve deficit.  Psychiatric:        Mood and Affect: Mood normal.        Behavior: Behavior normal.        Thought Content: Thought content normal.    LABS:      Latest Ref Rng & Units 06/06/2022   12:00 AM 02/21/2022   12:00  AM 11/23/2021   12:00 AM  CBC  WBC  9.9     9.3     9.0      Hemoglobin 12.0 - 16.0 11.5     12.1     11.2      Hematocrit 36 - 46 37     38     35      Platelets 150 - 400 K/uL 463     400     422         This result is from an external source.       Latest Ref Rng & Units 06/06/2022   12:00 AM 02/21/2022   12:00 AM 11/23/2021   12:00 AM  CMP  BUN 4 - '21 9     16     13      '$ Creatinine 0.5 - 1.1 0.7     0.8     0.6      Sodium 137 - 147 137     138     134      Potassium 3.5 - 5.1 mEq/L 3.8     4.1     3.9      Chloride 99 - 108 101     102     103      CO2 13 - '22 29     30     26      '$ Calcium 8.7 - 10.7 8.7     8.9     8.3      Alkaline Phos 25 - 125 74     78     78      AST 13 - 35 '28     31     19      '$ ALT 7 - 35 U/L '23     28     18         '$ This result is from an external source.      No results found for: "TOTALPROTELP", "ALBUMINELP", "A1GS", "A2GS", "BETS", "BETA2SER", "GAMS", "MSPIKE", "SPEI" Lab Results  Component Value  Date   TIBC 440 06/06/2022   TIBC 411 02/21/2022   TIBC 470 (H) 11/23/2021   FERRITIN 3 (L) 06/06/2022   FERRITIN 6 (L) 02/21/2022   FERRITIN 3 (L) 11/23/2021   IRONPCTSAT 9 (L) 06/06/2022   IRONPCTSAT 11 02/21/2022   IRONPCTSAT 9 (L) 11/23/2021   No results found for: "LDH"  STUDIES:    EXAM: 12/08/21 DIGITAL SCREENING BILATERAL MAMMOGRAM WITH TOMOSYNTHESIS AND CAD  TECHNIQUE:  Bilateral screening digital craniocaudal and mediolateral oblique  mammograms were obtained. Bilateral screening digital breast  tomosynthesis was performed. The images were evaluated with  computer-aided detection.  COMPARISON: Previous exam(s).  ACR Breast Density Category a: The breast tissue is almost entirely  fatty.  FINDINGS:  There are no findings suspicious for malignancy.  IMPRESSION:  No mammographic evidence of malignancy. A result letter of this  screening mammogram will be mailed directly to the patient.   RECOMMENDATION:  Screening mammogram  in one year. (Code:SM-B-01Y)   BI-RADS CATEGORY 1: Negative.   Electronically Signed  By: Ammie Ferrier M.D.  On: 12/08/2021 10:26  Electronically Signed By: Ammie Ferrier MD  Electronically Signed Date/Time: 01/20/231029   HISTORY:   Past Medical History:  Diagnosis Date   Asthma    Folate deficiency 11/24/2021   Hypertension    Iron deficiency anemia 08/25/2020   Iron deficiency anemia secondary to blood loss (chronic)     Past Surgical History:  Procedure Laterality Date   CHOLECYSTECTOMY      Family History  Adopted: Yes    Social History:  reports that she has never smoked. She has never used smokeless tobacco. She reports that she does not use drugs. No history on file for alcohol use.The patient is alone today.  Allergies:  Allergies  Allergen Reactions   Latex     UNKNOWN    Current Medications: Current Outpatient Medications  Medication Sig Dispense Refill   Folic Acid 0.8 MG CAPS      gabapentin (NEURONTIN) 300 MG capsule      Vitamin D, Ergocalciferol, (DRISDOL) 1.25 MG (50000 UNIT) CAPS capsule Take 50,000 Units by mouth once a week.     No current facility-administered medications for this visit.

## 2022-06-11 ENCOUNTER — Telehealth: Payer: Self-pay

## 2022-06-11 NOTE — Telephone Encounter (Signed)
-----   Message from Derwood Kaplan, MD sent at 06/07/2022  6:43 PM EDT ----- Regarding: call Tell her iron is definitely low but folic acid is still low also.  She needs to take 1 daily so she will have enough when the iron starts making more blood

## 2022-06-13 ENCOUNTER — Encounter: Payer: Self-pay | Admitting: Oncology

## 2022-06-13 NOTE — Addendum Note (Signed)
Addended by: Neysa Hotter on: 06/13/2022 10:20 AM   Modules accepted: Orders

## 2022-06-14 ENCOUNTER — Ambulatory Visit: Payer: BC Managed Care – PPO

## 2022-06-18 ENCOUNTER — Ambulatory Visit: Payer: BC Managed Care – PPO

## 2022-06-19 MED FILL — Iron Sucrose Inj 20 MG/ML (Fe Equiv): INTRAVENOUS | Qty: 10 | Status: AC

## 2022-06-20 ENCOUNTER — Inpatient Hospital Stay: Payer: BC Managed Care – PPO | Attending: Oncology

## 2022-06-20 VITALS — BP 143/62 | HR 70 | Temp 98.7°F | Resp 21 | Ht 60.0 in | Wt 390.0 lb

## 2022-06-20 DIAGNOSIS — D509 Iron deficiency anemia, unspecified: Secondary | ICD-10-CM | POA: Diagnosis not present

## 2022-06-20 DIAGNOSIS — D500 Iron deficiency anemia secondary to blood loss (chronic): Secondary | ICD-10-CM

## 2022-06-20 MED ORDER — SODIUM CHLORIDE 0.9 % IV SOLN
200.0000 mg | Freq: Once | INTRAVENOUS | Status: AC
  Administered 2022-06-20: 200 mg via INTRAVENOUS
  Filled 2022-06-20: qty 200

## 2022-06-20 MED ORDER — SODIUM CHLORIDE 0.9 % IV SOLN: Freq: Once | INTRAVENOUS | Status: AC

## 2022-06-20 NOTE — Patient Instructions (Signed)
Iron Deficiency Anemia, Adult Iron deficiency anemia is a condition in which the concentration of red blood cells or hemoglobin in the blood is below normal because of too little iron. Hemoglobin is a substance in red blood cells that carries oxygen to the body's tissues. When the concentration of red blood cells or hemoglobin is too low, not enough oxygen reaches these tissues. Iron deficiency anemia is usually long-lasting, and it develops over time. It may or may not cause symptoms. It is a common type of anemia. What are the causes? This condition may be caused by: Not enough iron in the diet. Abnormal absorption in the gut. Increased need for iron because of pregnancy or heavy menstrual periods, for females. Cancers of the gastrointestinal system, such as colon cancer. Blood loss caused by bleeding in the intestine. This may be from a gastrointestinal condition like Crohn's disease. Frequent blood draws, such as from blood donation. What increases the risk? The following factors may make you more likely to develop this condition: Being pregnant. Being a teenage girl going through a growth spurt. What are the signs or symptoms? Symptoms of this condition may include: Pale skin, lips, and nail beds. Weakness, dizziness, and getting tired easily. Headache. Shortness of breath when moving or exercising. Cold hands and feet. Fast or irregular heartbeat. Irritability or rapid breathing. These are more common in severe anemia. Mild anemia may not cause any symptoms. How is this diagnosed? This condition is diagnosed based on: Your medical history. A physical exam. Blood tests. You may have additional tests to find the underlying cause of your anemia, such as: Testing for blood in the stool (fecal occult blood test). A procedure to see inside your colon and rectum (colonoscopy). A procedure to see inside your esophagus and stomach (endoscopy). A test in which cells are removed from  bone marrow (bone marrow aspiration) or fluid is removed from the bone marrow to be examined. This is rarely needed. How is this treated? This condition is treated by correcting the cause of your iron deficiency. Treatment may involve: Adding iron-rich foods to your diet. Taking iron supplements. If you are pregnant or breastfeeding, you may need to take extra iron because your normal diet usually does not provide the amount of iron that you need. Increasing vitamin C intake. Vitamin C helps your body absorb iron. Your health care provider may recommend that you take iron supplements along with a glass of orange juice or a vitamin C supplement. Medicines to make heavy menstrual flow lighter. Surgery. You may need repeat blood tests to determine whether treatment is working. If the treatment does not seem to be working, you may need more tests. Follow these instructions at home: Medicines Take over-the-counter and prescription medicines only as told by your health care provider. This includes iron supplements and vitamins. For the best iron absorption, you should take iron supplements when your stomach is empty. If you cannot tolerate them on an empty stomach, you may need to take them with food. Do not drink milk or take antacids at the same time as your iron supplements. Milk and antacids may interfere with iron absorption. Iron supplements may turn stool (feces) a darker color and it may appear black. If you cannot tolerate taking iron supplements by mouth, talk with your health care provider about taking them through an IV or through an injection into a muscle. Eating and drinking  Talk with your health care provider before changing your diet. He or she may recommend   that you eat foods that contain a lot of iron, such as: Liver. Low-fat (lean) beef. Breads and cereals that have iron added to them (are fortified). Eggs. Dried fruit. Dark green, leafy vegetables. To help your body use the  iron from iron-rich foods, eat those foods at the same time as fresh fruits and vegetables that are high in vitamin C. Foods that are high in vitamin C include: Oranges. Peppers. Tomatoes. Mangoes. Drink enough fluid to keep your urine pale yellow. Managing constipation If you are taking an iron supplement, it may cause constipation. To prevent or treat constipation, you may need to: Take over-the-counter or prescription medicines. Eat foods that are high in fiber, such as beans, whole grains, and fresh fruits and vegetables. Limit foods that are high in fat and processed sugars, such as fried or sweet foods. General instructions Return to your normal activities as told by your health care provider. Ask your health care provider what activities are safe for you. Practice good hygiene. Anemia can make you more prone to illness and infection. Keep all follow-up visits as told by your health care provider. This is important. Contact a health care provider if you: Feel nauseous or you vomit. Feel weak. Have unexplained sweating. Develop symptoms of constipation, such as: Having fewer than three bowel movements a week. Straining to have a bowel movement. Having stools that are hard, dry, or larger than normal. Feeling full or bloated. Pain in the lower abdomen. Not feeling relief after having a bowel movement. Get help right away if you: Faint. If this happens, do not drive yourself to the hospital. Have chest pain. Have shortness of breath that: Is severe. Gets worse with physical activity. Have an irregular or rapid heartbeat. Become light-headed when getting up from a sitting or lying down position. These symptoms may represent a serious problem that is an emergency. Do not wait to see if the symptoms will go away. Get medical help right away. Call your local emergency services (911 in the [place] Do not drive yourself to the hospital. Summary Iron deficiency anemia is a condition in  which the concentration of red blood cells or hemoglobin in the blood is below normal because of too little iron. This condition is treated by correcting the cause of your iron deficiency. Take over-the-counter and prescription medicines only as told by your health care provider. This includes iron supplements and vitamins. To help your body use the iron from iron-rich foods, eat those foods at the same time as fresh fruits and vegetables that are high in vitamin C. Get help right away if you have shortness of breath that gets worse with physical activity. This information is not intended to replace advice given to you by your health care provider. Make sure you discuss any questions you have with your health care provider. Document Revised: 07/14/2019 Document Reviewed: 07/14/2019 Elsevier Patient Education  2022 [employer]vier Inc.  

## 2022-06-21 MED FILL — Iron Sucrose Inj 20 MG/ML (Fe Equiv): INTRAVENOUS | Qty: 10 | Status: AC

## 2022-06-22 ENCOUNTER — Inpatient Hospital Stay: Payer: BC Managed Care – PPO

## 2022-06-22 VITALS — BP 167/78 | HR 81 | Temp 98.1°F | Resp 20 | Ht 60.0 in | Wt 390.0 lb

## 2022-06-22 DIAGNOSIS — D500 Iron deficiency anemia secondary to blood loss (chronic): Secondary | ICD-10-CM

## 2022-06-22 DIAGNOSIS — D509 Iron deficiency anemia, unspecified: Secondary | ICD-10-CM | POA: Diagnosis not present

## 2022-06-22 MED ORDER — SODIUM CHLORIDE 0.9 % IV SOLN: Freq: Once | INTRAVENOUS | Status: AC

## 2022-06-22 MED ORDER — SODIUM CHLORIDE 0.9 % IV SOLN
200.0000 mg | Freq: Once | INTRAVENOUS | Status: AC
  Administered 2022-06-22: 200 mg via INTRAVENOUS
  Filled 2022-06-22: qty 200

## 2022-06-22 NOTE — Patient Instructions (Signed)

## 2022-06-25 ENCOUNTER — Inpatient Hospital Stay: Payer: BC Managed Care – PPO

## 2022-06-25 VITALS — BP 130/84 | HR 84 | Temp 98.0°F | Resp 20 | Ht 60.0 in | Wt 391.0 lb

## 2022-06-25 DIAGNOSIS — D500 Iron deficiency anemia secondary to blood loss (chronic): Secondary | ICD-10-CM

## 2022-06-25 DIAGNOSIS — D509 Iron deficiency anemia, unspecified: Secondary | ICD-10-CM | POA: Diagnosis not present

## 2022-06-25 MED ORDER — SODIUM CHLORIDE 0.9 % IV SOLN: Freq: Once | INTRAVENOUS | Status: AC

## 2022-06-25 MED ORDER — SODIUM CHLORIDE 0.9 % IV SOLN
200.0000 mg | Freq: Once | INTRAVENOUS | Status: AC
  Administered 2022-06-25: 200 mg via INTRAVENOUS
  Filled 2022-06-25: qty 200

## 2022-06-25 NOTE — Patient Instructions (Signed)

## 2022-06-26 MED FILL — Iron Sucrose Inj 20 MG/ML (Fe Equiv): INTRAVENOUS | Qty: 10 | Status: AC

## 2022-06-27 ENCOUNTER — Inpatient Hospital Stay: Payer: BC Managed Care – PPO

## 2022-06-27 VITALS — BP 153/72 | HR 74 | Temp 98.3°F | Resp 20 | Ht 60.0 in | Wt 393.0 lb

## 2022-06-27 DIAGNOSIS — D509 Iron deficiency anemia, unspecified: Secondary | ICD-10-CM | POA: Diagnosis not present

## 2022-06-27 DIAGNOSIS — D500 Iron deficiency anemia secondary to blood loss (chronic): Secondary | ICD-10-CM

## 2022-06-27 MED ORDER — SODIUM CHLORIDE 0.9 % IV SOLN
200.0000 mg | Freq: Once | INTRAVENOUS | Status: AC
  Administered 2022-06-27: 200 mg via INTRAVENOUS
  Filled 2022-06-27: qty 200

## 2022-06-27 MED ORDER — SODIUM CHLORIDE 0.9 % IV SOLN: Freq: Once | INTRAVENOUS | Status: AC

## 2022-06-27 NOTE — Patient Instructions (Signed)

## 2022-06-28 MED FILL — Iron Sucrose Inj 20 MG/ML (Fe Equiv): INTRAVENOUS | Qty: 10 | Status: AC

## 2022-06-29 ENCOUNTER — Inpatient Hospital Stay: Payer: BC Managed Care – PPO

## 2022-06-29 VITALS — BP 148/60 | HR 77 | Temp 98.9°F | Resp 20 | Ht 60.0 in | Wt 393.0 lb

## 2022-06-29 DIAGNOSIS — D509 Iron deficiency anemia, unspecified: Secondary | ICD-10-CM | POA: Diagnosis not present

## 2022-06-29 DIAGNOSIS — D500 Iron deficiency anemia secondary to blood loss (chronic): Secondary | ICD-10-CM

## 2022-06-29 MED ORDER — SODIUM CHLORIDE 0.9 % IV SOLN
200.0000 mg | Freq: Once | INTRAVENOUS | Status: AC
  Administered 2022-06-29: 200 mg via INTRAVENOUS
  Filled 2022-06-29: qty 200

## 2022-06-29 MED ORDER — SODIUM CHLORIDE 0.9 % IV SOLN: Freq: Once | INTRAVENOUS | Status: AC

## 2022-06-29 NOTE — Patient Instructions (Signed)

## 2022-07-03 ENCOUNTER — Other Ambulatory Visit: Payer: Self-pay | Admitting: Hematology and Oncology

## 2022-07-04 ENCOUNTER — Telehealth: Payer: Self-pay

## 2022-07-04 NOTE — Telephone Encounter (Signed)
-----   Message from Derwood Kaplan, MD sent at 07/02/2022  2:44 PM EDT ----- Regarding: RE: Note for Leah Mendoza, would you mind doing this?  We see her for iron def but also has morbid obesity. ----- Message ----- From: Belva Chimes, LPN Sent: 5/00/3704   1:54 PM EDT To: Derwood Kaplan, MD Subject: Note for Jury Duty                             Patient called wanting to know if a note can be written so she does not have to appear in jury duty. If so the note needs to be done by Thursday 07/05/22. Patient states that one of her other doctors usually write it but will not know until she sees him and pays her outstanding balance.

## 2022-07-04 NOTE — Telephone Encounter (Signed)
Note ready to be picked up by patient.

## 2022-09-24 ENCOUNTER — Encounter: Payer: Self-pay | Admitting: Oncology

## 2022-09-26 ENCOUNTER — Other Ambulatory Visit: Payer: Self-pay | Admitting: Hematology and Oncology

## 2022-09-26 ENCOUNTER — Other Ambulatory Visit: Payer: Self-pay

## 2022-09-26 ENCOUNTER — Inpatient Hospital Stay: Payer: BC Managed Care – PPO | Attending: Oncology

## 2022-09-26 DIAGNOSIS — D500 Iron deficiency anemia secondary to blood loss (chronic): Secondary | ICD-10-CM | POA: Insufficient documentation

## 2022-09-26 DIAGNOSIS — N920 Excessive and frequent menstruation with regular cycle: Secondary | ICD-10-CM | POA: Diagnosis not present

## 2022-09-26 LAB — IRON AND TIBC
Iron: 50 ug/dL (ref 28–170)
Saturation Ratios: 11 % (ref 10.4–31.8)
TIBC: 441 ug/dL (ref 250–450)
UIBC: 391 ug/dL

## 2022-09-26 LAB — CBC (CANCER CENTER ONLY)
HCT: 41 % (ref 36.0–46.0)
Hemoglobin: 13.1 g/dL (ref 12.0–15.0)
MCH: 28.4 pg (ref 26.0–34.0)
MCHC: 32 g/dL (ref 30.0–36.0)
MCV: 88.7 fL (ref 80.0–100.0)
Platelet Count: 398 10*3/uL (ref 150–400)
RBC: 4.62 MIL/uL (ref 3.87–5.11)
RDW: 13.5 % (ref 11.5–15.5)
WBC Count: 11.4 10*3/uL — ABNORMAL HIGH (ref 4.0–10.5)
nRBC: 0 % (ref 0.0–0.2)

## 2022-09-26 LAB — FERRITIN: Ferritin: 6 ng/mL — ABNORMAL LOW (ref 11–307)

## 2022-09-27 ENCOUNTER — Encounter: Payer: Self-pay | Admitting: Oncology

## 2022-10-03 ENCOUNTER — Telehealth: Payer: Self-pay

## 2022-10-03 NOTE — Telephone Encounter (Signed)
Patient notified and voiced understanding.

## 2022-10-03 NOTE — Telephone Encounter (Signed)
-----   Message from Marvia Pickles, PA-C sent at 10/02/2022 12:40 PM EST ----- Please let her know her hemoglobin was normal, but her white blood cell count was mildly elevated, so she may have infection causing her symptoms. Please have her see her PCP. She can let us know when she is feeling better and we can get her set up for IV iron again. Thanks

## 2022-10-23 ENCOUNTER — Telehealth: Payer: Self-pay | Admitting: Oncology

## 2022-10-23 NOTE — Telephone Encounter (Signed)
Patient has been scheduled. Aware of appt date and time    Scheduling Message Entered by [facility], Manuela Schwartz M on 09/27/2022 at  1:30 PM Priority: Routine <No visit type provided>  Department: [facility]  Provider:  Appointment Notes:  Please schedule pt for 5 doses of venofer  Scheduling Notes:

## 2022-10-25 ENCOUNTER — Encounter: Payer: Self-pay | Admitting: Oncology

## 2022-10-25 MED FILL — Iron Sucrose Inj 20 MG/ML (Fe Equiv): INTRAVENOUS | Qty: 10 | Status: AC

## 2022-10-26 ENCOUNTER — Inpatient Hospital Stay: Payer: BC Managed Care – PPO | Attending: Oncology

## 2022-10-26 VITALS — BP 180/74 | HR 74 | Temp 98.6°F | Resp 18

## 2022-10-26 DIAGNOSIS — D509 Iron deficiency anemia, unspecified: Secondary | ICD-10-CM | POA: Diagnosis not present

## 2022-10-26 DIAGNOSIS — E538 Deficiency of other specified B group vitamins: Secondary | ICD-10-CM | POA: Insufficient documentation

## 2022-10-26 DIAGNOSIS — D500 Iron deficiency anemia secondary to blood loss (chronic): Secondary | ICD-10-CM

## 2022-10-26 MED ORDER — SODIUM CHLORIDE 0.9 % IV SOLN
200.0000 mg | Freq: Once | INTRAVENOUS | Status: AC
  Administered 2022-10-26: 200 mg via INTRAVENOUS
  Filled 2022-10-26: qty 200

## 2022-10-26 MED ORDER — SODIUM CHLORIDE 0.9% FLUSH: 10.0000 mL | Freq: Once | INTRAVENOUS | Status: DC | PRN

## 2022-10-26 MED ORDER — SODIUM CHLORIDE 0.9 % IV SOLN: Freq: Once | INTRAVENOUS | Status: AC

## 2022-10-26 MED ORDER — HEPARIN SOD (PORK) LOCK FLUSH 100 UNIT/ML IV SOLN: 500.0000 [IU] | Freq: Once | INTRAVENOUS | Status: DC | PRN

## 2022-10-26 MED FILL — Iron Sucrose Inj 20 MG/ML (Fe Equiv): INTRAVENOUS | Qty: 10 | Status: AC

## 2022-10-26 NOTE — Patient Instructions (Signed)

## 2022-10-29 ENCOUNTER — Inpatient Hospital Stay: Payer: BC Managed Care – PPO

## 2022-10-29 VITALS — BP 126/62 | HR 79 | Temp 99.1°F | Resp 21 | Ht 60.0 in | Wt 390.2 lb

## 2022-10-29 DIAGNOSIS — D500 Iron deficiency anemia secondary to blood loss (chronic): Secondary | ICD-10-CM

## 2022-10-29 DIAGNOSIS — E538 Deficiency of other specified B group vitamins: Secondary | ICD-10-CM | POA: Diagnosis not present

## 2022-10-29 DIAGNOSIS — D509 Iron deficiency anemia, unspecified: Secondary | ICD-10-CM | POA: Diagnosis not present

## 2022-10-29 MED ORDER — SODIUM CHLORIDE 0.9 % IV SOLN
200.0000 mg | Freq: Once | INTRAVENOUS | Status: AC
  Administered 2022-10-29: 200 mg via INTRAVENOUS
  Filled 2022-10-29: qty 200

## 2022-10-29 MED ORDER — SODIUM CHLORIDE 0.9 % IV SOLN: Freq: Once | INTRAVENOUS | Status: AC

## 2022-10-29 MED FILL — Iron Sucrose Inj 20 MG/ML (Fe Equiv): INTRAVENOUS | Qty: 10 | Status: AC

## 2022-10-29 NOTE — Patient Instructions (Signed)
Iron Deficiency Anemia, Adult  Iron deficiency anemia is a condition in which the concentration of red blood cells or hemoglobin in the blood is below normal because of too little iron. Hemoglobin is a substance in red blood cells that carries oxygen to the body's tissues. When the concentration of red blood cells or hemoglobin is too low, not enough oxygen reaches these tissues. Iron deficiency anemia is usually long-lasting, and it develops over time. It may or may not cause symptoms. It is a common type of anemia. What are the causes? This condition may be caused by: Not enough iron in the diet. Abnormal absorption in the gut. Blood loss. What increases the risk? You are more likely to develop this condition if you get menstrual periods (menstruate) or are pregnant. What are the signs or symptoms? Symptoms of this condition may include: Pale skin, lips, and nail beds. Weakness, dizziness, and getting tired easily. Shortness of breath when moving or exercising. Cold hands or feet. Mild anemia may not cause any symptoms. How is this diagnosed? This condition is diagnosed based on: Your medical history. A physical exam. Blood tests. How is this treated? This condition is treated by correcting the cause of your iron deficiency. Treatment may involve: Adding iron-rich foods to your diet. Taking iron supplements. If you are pregnant or breastfeeding, you may need to take extra iron because your normal diet usually does not provide the amount of iron that you need. Increasing vitamin C intake. Vitamin C helps your body absorb iron. Your health care provider may recommend that you take iron supplements along with a glass of orange juice or a vitamin C supplement. Medicines to make heavy menstrual flow lighter. Surgery or additional testing procedures to determine the cause of your anemia. You may need repeat blood tests to determine whether treatment is working. If the treatment does not  seem to be working, you may need more tests. Follow these instructions at home: Medicines Take over-the-counter and prescription medicines only as told by your health care provider. This includes iron supplements and vitamins. This is important because too much iron can be harmful. For the best iron absorption, you should take iron supplements when your stomach is empty. If you cannot tolerate them on an empty stomach, you may need to take them with food. Do not drink milk or take antacids at the same time as your iron supplements. Milk and antacids may interfere with how your body absorbs iron. Iron supplements may turn stool (feces) a darker color and it may appear black. If you cannot tolerate taking iron supplements by mouth, talk with your health care provider about taking them through an IV or through an injection into a muscle. Eating and drinking Talk with your health care provider before changing your diet. Your provider may recommend that you eat foods that contain a lot of iron, such as: Liver. Low-fat (lean) beef. Breads and cereals that have iron added to them (are fortified). Eggs. Dried fruit. Dark green, leafy vegetables. To help your body use the iron from iron-rich foods, eat those foods at the same time as fresh fruits and vegetables that are high in vitamin C. Foods that are high in vitamin C include: Oranges. Peppers. Tomatoes. Mangoes. Managing constipation If you are taking an iron supplement, it may cause constipation. To prevent or treat constipation, you may need to: Drink enough fluid to keep your urine pale yellow. Take over-the-counter or prescription medicines. Eat foods that are high in fiber, such   as beans, whole grains, and fresh fruits and vegetables. Limit foods that are high in fat and processed sugars, such as fried or sweet foods. General instructions Return to your normal activities as told by your health care provider. Ask your health care provider  what activities are safe for you. Keep all follow-up visits. Contact a health care provider if: You feel nauseous or you vomit. You feel weak. You become light-headed when getting up from a sitting or lying down position. You have unexplained sweating. You develop symptoms of constipation. You have a heaviness in your chest. You have trouble breathing with physical activity. Get help right away if: You faint. If this happens, do not drive yourself to the hospital. You have an irregular or rapid heartbeat. Summary Iron deficiency anemia is a condition in which the concentration of red blood cells or hemoglobin in the blood is below normal because of too little iron. This condition is treated by correcting the cause of your iron deficiency. Take over-the-counter and prescription medicines only as told by your health care provider. This includes iron supplements and vitamins. To help your body use the iron from iron-rich foods, eat those foods at the same time as fresh fruits and vegetables that are high in vitamin C. Seek medical help if you have signs or symptoms of worsening anemia. This information is not intended to replace advice given to you by your health care provider. Make sure you discuss any questions you have with your health care provider. Document Revised: 12/13/2021 Document Reviewed: 12/13/2021 Elsevier Patient Education  2023 [employer]  

## 2022-10-30 ENCOUNTER — Inpatient Hospital Stay: Payer: BC Managed Care – PPO

## 2022-10-30 VITALS — BP 153/71 | HR 80 | Temp 98.0°F | Resp 22

## 2022-10-30 DIAGNOSIS — D509 Iron deficiency anemia, unspecified: Secondary | ICD-10-CM | POA: Diagnosis not present

## 2022-10-30 DIAGNOSIS — D500 Iron deficiency anemia secondary to blood loss (chronic): Secondary | ICD-10-CM

## 2022-10-30 DIAGNOSIS — E538 Deficiency of other specified B group vitamins: Secondary | ICD-10-CM | POA: Diagnosis not present

## 2022-10-30 MED ORDER — SODIUM CHLORIDE 0.9 % IV SOLN: Freq: Once | INTRAVENOUS | Status: AC

## 2022-10-30 MED ORDER — SODIUM CHLORIDE 0.9 % IV SOLN
200.0000 mg | Freq: Once | INTRAVENOUS | Status: AC
  Administered 2022-10-30: 200 mg via INTRAVENOUS
  Filled 2022-10-30: qty 200

## 2022-10-30 MED FILL — Iron Sucrose Inj 20 MG/ML (Fe Equiv): INTRAVENOUS | Qty: 10 | Status: AC

## 2022-10-30 NOTE — Patient Instructions (Signed)

## 2022-10-31 ENCOUNTER — Inpatient Hospital Stay: Payer: BC Managed Care – PPO

## 2022-10-31 VITALS — BP 155/71 | HR 81 | Temp 98.3°F | Resp 22

## 2022-10-31 DIAGNOSIS — E538 Deficiency of other specified B group vitamins: Secondary | ICD-10-CM | POA: Diagnosis not present

## 2022-10-31 DIAGNOSIS — D509 Iron deficiency anemia, unspecified: Secondary | ICD-10-CM | POA: Diagnosis not present

## 2022-10-31 DIAGNOSIS — D500 Iron deficiency anemia secondary to blood loss (chronic): Secondary | ICD-10-CM

## 2022-10-31 MED ORDER — SODIUM CHLORIDE 0.9 % IV SOLN: Freq: Once | INTRAVENOUS | Status: AC

## 2022-10-31 MED ORDER — SODIUM CHLORIDE 0.9 % IV SOLN
200.0000 mg | Freq: Once | INTRAVENOUS | Status: AC
  Administered 2022-10-31: 200 mg via INTRAVENOUS
  Filled 2022-10-31: qty 200

## 2022-10-31 MED FILL — Iron Sucrose Inj 20 MG/ML (Fe Equiv): INTRAVENOUS | Qty: 10 | Status: AC

## 2022-10-31 NOTE — Patient Instructions (Signed)

## 2022-11-01 ENCOUNTER — Inpatient Hospital Stay: Payer: BC Managed Care – PPO

## 2022-11-01 VITALS — BP 149/71 | HR 88 | Temp 98.0°F | Resp 22

## 2022-11-01 DIAGNOSIS — E538 Deficiency of other specified B group vitamins: Secondary | ICD-10-CM | POA: Diagnosis not present

## 2022-11-01 DIAGNOSIS — D500 Iron deficiency anemia secondary to blood loss (chronic): Secondary | ICD-10-CM

## 2022-11-01 DIAGNOSIS — D509 Iron deficiency anemia, unspecified: Secondary | ICD-10-CM | POA: Diagnosis not present

## 2022-11-01 MED ORDER — SODIUM CHLORIDE 0.9 % IV SOLN
200.0000 mg | Freq: Once | INTRAVENOUS | Status: AC
  Administered 2022-11-01: 200 mg via INTRAVENOUS
  Filled 2022-11-01: qty 200

## 2022-11-01 MED ORDER — SODIUM CHLORIDE 0.9 % IV SOLN: Freq: Once | INTRAVENOUS | Status: AC

## 2022-11-01 NOTE — Patient Instructions (Signed)

## 2022-12-07 ENCOUNTER — Inpatient Hospital Stay: Payer: BC Managed Care – PPO

## 2022-12-07 ENCOUNTER — Encounter: Payer: Self-pay | Admitting: Hematology and Oncology

## 2022-12-07 ENCOUNTER — Inpatient Hospital Stay: Payer: BC Managed Care – PPO | Attending: Oncology | Admitting: Hematology and Oncology

## 2022-12-07 VITALS — BP 162/93 | HR 85 | Temp 98.1°F | Resp 20 | Ht 60.0 in | Wt 388.4 lb

## 2022-12-07 DIAGNOSIS — D500 Iron deficiency anemia secondary to blood loss (chronic): Secondary | ICD-10-CM

## 2022-12-07 DIAGNOSIS — E538 Deficiency of other specified B group vitamins: Secondary | ICD-10-CM | POA: Diagnosis not present

## 2022-12-07 DIAGNOSIS — J988 Other specified respiratory disorders: Secondary | ICD-10-CM | POA: Diagnosis not present

## 2022-12-07 DIAGNOSIS — D509 Iron deficiency anemia, unspecified: Secondary | ICD-10-CM | POA: Diagnosis not present

## 2022-12-07 DIAGNOSIS — Z1231 Encounter for screening mammogram for malignant neoplasm of breast: Secondary | ICD-10-CM | POA: Diagnosis not present

## 2022-12-07 LAB — CBC AND DIFFERENTIAL
HCT: 44 (ref 36–46)
Hemoglobin: 14.4 (ref 12.0–16.0)
MCV: 89 (ref 81–99)
Neutrophils Absolute: 6.9
Platelets: 334 10*3/uL (ref 150–400)
WBC: 10.3

## 2022-12-07 LAB — CMP (CANCER CENTER ONLY)
ALT: 19 U/L (ref 0–44)
AST: 18 U/L (ref 15–41)
Albumin: 3.9 g/dL (ref 3.5–5.0)
Alkaline Phosphatase: 82 U/L (ref 38–126)
Anion gap: 11 (ref 5–15)
BUN: 17 mg/dL (ref 6–20)
CO2: 27 mmol/L (ref 22–32)
Calcium: 9.4 mg/dL (ref 8.9–10.3)
Chloride: 97 mmol/L — ABNORMAL LOW (ref 98–111)
Creatinine: 0.75 mg/dL (ref 0.44–1.00)
GFR, Estimated: >60 mL/min (ref >60)
Glucose, Bld: 298 mg/dL — ABNORMAL HIGH (ref 70–99)
Potassium: 4.1 mmol/L (ref 3.5–5.1)
Sodium: 135 mmol/L (ref 135–145)
Total Bilirubin: 0.3 mg/dL (ref 0.3–1.2)
Total Protein: 7.6 g/dL (ref 6.5–8.1)

## 2022-12-07 LAB — IRON AND TIBC
Iron: 90 ug/dL (ref 28–170)
Saturation Ratios: 26 % (ref 10.4–31.8)
TIBC: 351 ug/dL (ref 250–450)
UIBC: 261 ug/dL

## 2022-12-07 LAB — FERRITIN: Ferritin: 89 ng/mL (ref 11–307)

## 2022-12-07 LAB — CBC: RBC: 4.98 (ref 3.87–5.11)

## 2022-12-07 MED ORDER — DOXYCYCLINE MONOHYDRATE 100 MG PO CAPS: 100.0000 mg | ORAL_CAPSULE | Freq: Two times a day (BID) | ORAL | 0 refills | Status: DC

## 2022-12-07 NOTE — Assessment & Plan Note (Addendum)
Longstanding history of iron deficiency anemia with associated reactive thrombocytosis due to menorrhagia.  She contacted [place] off schedule in November as she was feeling poorly.  Her hemoglobin was normal, but her ferritin had dropped to 6, so she received IV iron again in December. Her hemoglobin is normal and iron stores adequate.  She reports continued fatigue, which is not related to anemia or iron deficiency.  I recommended she have annual follow-up with her primary care provider for routine health maintenance.  I will plan to see her back in 6 months for repeat clinical assessment.

## 2022-12-07 NOTE — Assessment & Plan Note (Signed)
Folate deficiency resolved with oral supplementation, which she continues.

## 2022-12-07 NOTE — Assessment & Plan Note (Signed)
Persistent cough for over a week which is now productive of purulent sputum, so more likely bacterial.  I will give her a course of doxycycline.

## 2022-12-07 NOTE — Progress Notes (Signed)
[facility]  [address] [place],  [place]  [address] [phone]   Addendum: After the patient left the clinic, her blood sugar returned at 298,, so we contacted her to have her see her primary care provider as soon as possible.  Clinic Day:  12/07/2022  Referring physician: [facility] [place]:   Assessment & Plan: Iron deficiency anemia Longstanding history of iron deficiency anemia with associated reactive thrombocytosis due to menorrhagia.  She contacted [place] off schedule in November as she was feeling poorly.  Her hemoglobin was normal, but her ferritin had dropped to 6, so she received IV iron again in December. Her hemoglobin is normal and iron stores adequate.  She reports continued fatigue, which is not related to anemia or iron deficiency.  I recommended she have annual follow-up with her primary care provider for routine health maintenance.  I will plan to see her back in 6 months for repeat clinical assessment.   Folate deficiency Folate deficiency resolved with oral supplementation, which she continues.  Respiratory infection Persistent cough for over a week which is now productive of purulent sputum, so more likely bacterial.  I will give her a course of doxycycline.   The patient understands the plans discussed today and is in agreement with them.  She knows to contact our office if she develops concerns prior to her next appointment.     Marvia Pickles, PA-C  [facility] [address] [place] [address] Dept: [phone] Dept Fax: [phone]   Orders Placed This Encounter  Procedures   MM DIGITAL SCREENING BILATERAL    Standing Status:   Future    Standing Expiration Date:   12/08/2023    Scheduling Instructions:     RH    Order Specific Question:   Reason for exam:    Answer:   screening for breast cancer    Order Specific  Question:   Preferred imaging location?    Answer:   External   CBC (Cancer Center Only)    Standing Status:   Future    Standing Expiration Date:   12/08/2023   Ferritin    Standing Status:   Future    Standing Expiration Date:   12/08/2023   Iron and TIBC    Standing Status:   Future    Standing Expiration Date:   12/08/2023   CBC and differential    This external order was created through the Results Console.   CBC    This external order was created through the Results Console.      CHIEF COMPLAINT:  CC: Iron deficiency  Current Treatment: IV iron as needed  HISTORY OF PRESENT ILLNESS:  Leah Mendoza is a 47 y.o. female with microcytic anemia secondary to iron deficiency with associated reactive thrombocytosis.  This has been an ongoing problem for years, felt to be due to menorrhagia.  She does not tolerate oral iron supplementation, so receives IV iron on an as needed basis.  She has been referred to gynecology in the past, but apparently there are financial issues.  She had an EGD in April 2013 with Dr. Lyda Jester for upper abdominal pain, which did not reveal any abnormality.  She had colonoscopy with Dr. Melina Copa in June 2015, which was also normal.  Routine screening colonoscopy at age 25 was recommended, but now that has changed to age 70, so she likely  will have repeat screening colonoscopy at age 52, which would be 10 years from the last.  She has received numerous IV iron infusions over the years when she becomes more anemic and symptomatic.  CT abdomen and pelvis in June 2015 revealed endometrial thickening, for which we referred her to [facility] in June 2015, but she never went.  Repeat CT abdomen and pelvis and February 2016 in the emergency department revealed a left adnexal soft tissue density most consistent with an ovarian cyst.  Pelvic ultrasound was recommended and revealed bilateral ovarian cysts. The endometrium looked normal on the ultrasound.     She has required IV iron regularly over the years.  She received Venofer again in August. She contacted our office in November, as she was not feeling well.  Her hemoglobin was normal, but she had recurrent iron deficiency with a ferritin of 6.  She received IV Venofer again in December.  INTERVAL HISTORY:  Leah Mendoza is here today for repeat clinical assessment.  She continues to report fatigue despite receiving IV iron in December.  She denies fevers or chills. She denies pain. Her appetite is good. Her weight has decreased 2 pounds over last month .  Bilateral screening mammogram in January 2023 did not reveal any evidence of malignancy.  She will be due for repeat screening mammogram later this month, which I will schedule.  I recommended she see her PCP at least annually for routine health maintenance.  REVIEW OF SYSTEMS:  Review of Systems  Constitutional:  Positive for fatigue. Negative for appetite change, chills, fever and unexpected weight change.  HENT:   Negative for lump/mass, mouth sores and sore throat.   Respiratory:  Positive for cough. Negative for shortness of breath.   Cardiovascular:  Negative for chest pain and leg swelling.  Gastrointestinal:  Negative for abdominal pain, constipation, diarrhea, nausea and vomiting.  Endocrine: Negative for hot flashes.  Genitourinary:  Negative for difficulty urinating, dysuria, frequency and hematuria.   Musculoskeletal:  Negative for arthralgias, back pain and myalgias.  Skin:  Negative for rash.  Neurological:  Negative for dizziness and headaches.  Hematological:  Negative for adenopathy. Does not bruise/bleed easily.  Psychiatric/Behavioral:  Negative for depression and sleep disturbance. The patient is not nervous/anxious.      VITALS:  Blood pressure (!) 162/93, pulse 85, temperature 98.1 F (36.7 C), resp. rate 20, height 5' (1.524 m), weight (!) 388 lb 6.4 oz (176.2 kg), SpO2 100 %.  Wt Readings from Last 3 Encounters:   12/07/22 (!) 388 lb 6.4 oz (176.2 kg)  10/29/22 (!) 390 lb 4 oz (177 kg)  06/29/22 (!) 393 lb (178.3 kg)    Body mass index is 75.85 kg/m.  Performance status (ECOG): 1 - Symptomatic but completely ambulatory  PHYSICAL EXAM:  Physical Exam Vitals and nursing note reviewed.  Constitutional:      General: She is not in acute distress.    Appearance: Normal appearance.  HENT:     Head: Normocephalic and atraumatic.     Mouth/Throat:     Mouth: Mucous membranes are moist.     Pharynx: Oropharynx is clear. No oropharyngeal exudate or posterior oropharyngeal erythema.  Eyes:     General: No scleral icterus.    Extraocular Movements: Extraocular movements intact.     Conjunctiva/sclera: Conjunctivae normal.     Pupils: Pupils are equal, round, and reactive to light.  Cardiovascular:     Rate and Rhythm: Normal rate and regular rhythm.  Heart sounds: Heart sounds are distant (likely due to body habitus). No murmur heard.    No friction rub. No gallop.  Pulmonary:     Effort: Pulmonary effort is normal.     Breath sounds: Normal breath sounds. No wheezing, rhonchi or rales.  Abdominal:     General: There is no distension.     Palpations: Abdomen is soft. There is no hepatomegaly, splenomegaly or mass.     Tenderness: There is no abdominal tenderness.  Musculoskeletal:        General: Normal range of motion.     Cervical back: Normal range of motion and neck supple. No tenderness.     Right lower leg: No edema.     Left lower leg: No edema.  Lymphadenopathy:     Cervical: No cervical adenopathy.     Upper Body:     Right upper body: No supraclavicular or axillary adenopathy.     Left upper body: No supraclavicular or axillary adenopathy.     Lower Body: No right inguinal adenopathy. No left inguinal adenopathy.  Skin:    General: Skin is warm and dry.     Coloration: Skin is not jaundiced.     Findings: No rash.  Neurological:     Mental Status: She is alert and  oriented to person, place, and time.     Cranial Nerves: No cranial nerve deficit.  Psychiatric:        Mood and Affect: Mood normal.        Behavior: Behavior normal.        Thought Content: Thought content normal.     LABS:      Latest Ref Rng & Units 12/07/2022   12:00 AM 09/26/2022    3:55 PM 06/06/2022   12:00 AM  CBC  WBC  10.3     11.4  9.9      Hemoglobin 12.0 - 16.0 14.4     13.1  11.5      Hematocrit 36 - 46 44     41.0  37      Platelets 150 - 400 K/uL 334     398  463         This result is from an external source.      Latest Ref Rng & Units 12/07/2022    2:08 PM 06/06/2022   12:00 AM 02/21/2022   12:00 AM  CMP  Glucose 70 - 99 mg/dL 298     BUN 6 - 20 mg/dL 17  9     16      $ Creatinine 0.44 - 1.00 mg/dL 0.75  0.7     0.8      Sodium 135 - 145 mmol/L 135  137     138      Potassium 3.5 - 5.1 mmol/L 4.1  3.8     4.1      Chloride 98 - 111 mmol/L 97  101     102      CO2 22 - 32 mmol/L 27  29     30      $ Calcium 8.9 - 10.3 mg/dL 9.4  8.7     8.9      Total Protein 6.5 - 8.1 g/dL 7.6     Total Bilirubin 0.3 - 1.2 mg/dL 0.3     Alkaline Phos 38 - 126 U/L 82  74     78      AST 15 - 41 U/L 18  28     31      ALT 0 - 44 U/L 19  23     28         $ This result is from an external source.     No results found for: "CEA1", "CEA" / No results found for: "CEA1", "CEA" No results found for: "PSA1" No results found for: "[id number]" No results found for: "CAN125"  No results found for: "TOTALPROTELP", "ALBUMINELP", "A1GS", "A2GS", "BETS", "BETA2SER", "GAMS", "MSPIKE", "SPEI" Lab Results  Component Value Date   TIBC 351 12/07/2022   TIBC 441 09/26/2022   TIBC 440 06/06/2022   FERRITIN 89 12/07/2022   FERRITIN 6 (L) 09/26/2022   FERRITIN 3 (L) 06/06/2022   IRONPCTSAT 26 12/07/2022   IRONPCTSAT 11 09/26/2022   IRONPCTSAT 9 (L) 06/06/2022   No results found for: "LDH"  STUDIES:  No results found.    HISTORY:   Past Medical History:  Diagnosis Date   Asthma     Folate deficiency 11/24/2021   Hypertension    Iron deficiency anemia 08/25/2020   Iron deficiency anemia secondary to blood loss (chronic)     Past Surgical History:  Procedure Laterality Date   CHOLECYSTECTOMY      Family History  Adopted: Yes    Social History:  reports that she has never smoked. She has never used smokeless tobacco. She reports that she does not use drugs. No history on file for alcohol use.The patient is alone today.  Allergies:  Allergies  Allergen Reactions   Latex     UNKNOWN    Current Medications: Current Outpatient Medications  Medication Sig Dispense Refill   doxycycline (MONODOX) 100 MG capsule Take 1 capsule (100 mg total) by mouth 2 (two) times daily. 14 capsule 0   Folic Acid 0.8 MG CAPS      No current facility-administered medications for this visit.

## 2022-12-10 ENCOUNTER — Encounter: Payer: Self-pay | Admitting: Oncology

## 2022-12-10 ENCOUNTER — Telehealth: Payer: Self-pay

## 2022-12-10 NOTE — Telephone Encounter (Signed)
Left message for patient to call me back regarding labwork. Cell phone not working per recorded message. Message left on home phone.

## 2022-12-10 NOTE — Telephone Encounter (Signed)
Patient called back.  I relayed Leah Mendoza. PA message to her and advised an appt with PCP.

## 2022-12-10 NOTE — Telephone Encounter (Signed)
-----  Message from Marvia Pickles, PA-C sent at 12/10/2022  8:52 AM EST ----- Please let her know her iron stores are normal.  Her blood sugar was 298 so she needs to see PCP asap. Thanks

## 2023-01-03 DIAGNOSIS — Z7689 Persons encountering health services in other specified circumstances: Secondary | ICD-10-CM | POA: Diagnosis not present

## 2023-01-03 DIAGNOSIS — D509 Iron deficiency anemia, unspecified: Secondary | ICD-10-CM | POA: Diagnosis not present

## 2023-01-03 DIAGNOSIS — Z6841 Body Mass Index (BMI) 40.0 and over, adult: Secondary | ICD-10-CM | POA: Diagnosis not present

## 2023-01-03 DIAGNOSIS — E1165 Type 2 diabetes mellitus with hyperglycemia: Secondary | ICD-10-CM | POA: Diagnosis not present

## 2023-01-14 DIAGNOSIS — R112 Nausea with vomiting, unspecified: Secondary | ICD-10-CM | POA: Diagnosis not present

## 2023-01-14 DIAGNOSIS — Z79899 Other long term (current) drug therapy: Secondary | ICD-10-CM | POA: Diagnosis not present

## 2023-01-14 DIAGNOSIS — E1110 Type 2 diabetes mellitus with ketoacidosis without coma: Secondary | ICD-10-CM | POA: Diagnosis not present

## 2023-01-14 DIAGNOSIS — Z91040 Latex allergy status: Secondary | ICD-10-CM | POA: Diagnosis not present

## 2023-01-14 DIAGNOSIS — N390 Urinary tract infection, site not specified: Secondary | ICD-10-CM | POA: Diagnosis not present

## 2023-01-14 DIAGNOSIS — Z87440 Personal history of urinary (tract) infections: Secondary | ICD-10-CM | POA: Diagnosis not present

## 2023-01-14 DIAGNOSIS — R531 Weakness: Secondary | ICD-10-CM | POA: Diagnosis not present

## 2023-01-14 DIAGNOSIS — D649 Anemia, unspecified: Secondary | ICD-10-CM | POA: Diagnosis not present

## 2023-01-14 DIAGNOSIS — J45909 Unspecified asthma, uncomplicated: Secondary | ICD-10-CM | POA: Diagnosis not present

## 2023-01-14 DIAGNOSIS — Z6841 Body Mass Index (BMI) 40.0 and over, adult: Secondary | ICD-10-CM | POA: Diagnosis not present

## 2023-01-14 DIAGNOSIS — Z7984 Long term (current) use of oral hypoglycemic drugs: Secondary | ICD-10-CM | POA: Diagnosis not present

## 2023-01-14 DIAGNOSIS — Z794 Long term (current) use of insulin: Secondary | ICD-10-CM | POA: Diagnosis not present

## 2023-01-17 DIAGNOSIS — E1165 Type 2 diabetes mellitus with hyperglycemia: Secondary | ICD-10-CM | POA: Diagnosis not present

## 2023-01-21 DIAGNOSIS — E1165 Type 2 diabetes mellitus with hyperglycemia: Secondary | ICD-10-CM | POA: Diagnosis not present

## 2023-01-21 DIAGNOSIS — Z6841 Body Mass Index (BMI) 40.0 and over, adult: Secondary | ICD-10-CM | POA: Diagnosis not present

## 2023-01-21 DIAGNOSIS — Z7689 Persons encountering health services in other specified circumstances: Secondary | ICD-10-CM | POA: Diagnosis not present

## 2023-01-23 DIAGNOSIS — E1165 Type 2 diabetes mellitus with hyperglycemia: Secondary | ICD-10-CM | POA: Diagnosis not present

## 2023-01-23 DIAGNOSIS — Z6841 Body Mass Index (BMI) 40.0 and over, adult: Secondary | ICD-10-CM | POA: Diagnosis not present

## 2023-01-23 DIAGNOSIS — Z713 Dietary counseling and surveillance: Secondary | ICD-10-CM | POA: Diagnosis not present

## 2023-03-18 DIAGNOSIS — M25561 Pain in right knee: Secondary | ICD-10-CM | POA: Diagnosis not present

## 2023-03-18 DIAGNOSIS — E119 Type 2 diabetes mellitus without complications: Secondary | ICD-10-CM | POA: Diagnosis not present

## 2023-03-18 DIAGNOSIS — M1711 Unilateral primary osteoarthritis, right knee: Secondary | ICD-10-CM | POA: Diagnosis not present

## 2023-03-18 DIAGNOSIS — I10 Essential (primary) hypertension: Secondary | ICD-10-CM | POA: Diagnosis not present

## 2023-03-18 DIAGNOSIS — M25461 Effusion, right knee: Secondary | ICD-10-CM | POA: Diagnosis not present

## 2023-03-18 DIAGNOSIS — Z794 Long term (current) use of insulin: Secondary | ICD-10-CM | POA: Diagnosis not present

## 2023-04-09 DIAGNOSIS — M25462 Effusion, left knee: Secondary | ICD-10-CM | POA: Diagnosis not present

## 2023-04-09 DIAGNOSIS — M25562 Pain in left knee: Secondary | ICD-10-CM | POA: Diagnosis not present

## 2023-04-16 DIAGNOSIS — M25561 Pain in right knee: Secondary | ICD-10-CM | POA: Diagnosis not present

## 2023-04-24 DIAGNOSIS — M25562 Pain in left knee: Secondary | ICD-10-CM | POA: Diagnosis not present

## 2023-04-24 DIAGNOSIS — M23322 Other meniscus derangements, posterior horn of medial meniscus, left knee: Secondary | ICD-10-CM | POA: Diagnosis not present

## 2023-04-24 DIAGNOSIS — M25462 Effusion, left knee: Secondary | ICD-10-CM | POA: Diagnosis not present

## 2023-05-10 DIAGNOSIS — G8918 Other acute postprocedural pain: Secondary | ICD-10-CM | POA: Diagnosis not present

## 2023-05-10 DIAGNOSIS — M23322 Other meniscus derangements, posterior horn of medial meniscus, left knee: Secondary | ICD-10-CM | POA: Diagnosis not present

## 2023-05-10 DIAGNOSIS — Z794 Long term (current) use of insulin: Secondary | ICD-10-CM | POA: Diagnosis not present

## 2023-05-10 DIAGNOSIS — Z6841 Body Mass Index (BMI) 40.0 and over, adult: Secondary | ICD-10-CM | POA: Diagnosis not present

## 2023-05-10 DIAGNOSIS — M1711 Unilateral primary osteoarthritis, right knee: Secondary | ICD-10-CM | POA: Diagnosis not present

## 2023-05-10 DIAGNOSIS — R2689 Other abnormalities of gait and mobility: Secondary | ICD-10-CM | POA: Diagnosis not present

## 2023-05-10 DIAGNOSIS — E119 Type 2 diabetes mellitus without complications: Secondary | ICD-10-CM | POA: Diagnosis not present

## 2023-05-10 DIAGNOSIS — S83241A Other tear of medial meniscus, current injury, right knee, initial encounter: Secondary | ICD-10-CM | POA: Diagnosis not present

## 2023-05-10 DIAGNOSIS — W182XXA Fall in (into) shower or empty bathtub, initial encounter: Secondary | ICD-10-CM | POA: Diagnosis not present

## 2023-05-10 DIAGNOSIS — J45909 Unspecified asthma, uncomplicated: Secondary | ICD-10-CM | POA: Diagnosis not present

## 2023-05-10 DIAGNOSIS — M23321 Other meniscus derangements, posterior horn of medial meniscus, right knee: Secondary | ICD-10-CM | POA: Diagnosis not present

## 2023-05-10 DIAGNOSIS — I10 Essential (primary) hypertension: Secondary | ICD-10-CM | POA: Diagnosis not present

## 2023-05-10 DIAGNOSIS — Z91040 Latex allergy status: Secondary | ICD-10-CM | POA: Diagnosis not present

## 2023-05-10 HISTORY — PX: KNEE ARTHROSCOPY W/ MENISCAL REPAIR: SHX1877

## 2023-05-11 DIAGNOSIS — Z91040 Latex allergy status: Secondary | ICD-10-CM | POA: Diagnosis not present

## 2023-05-11 DIAGNOSIS — J45909 Unspecified asthma, uncomplicated: Secondary | ICD-10-CM | POA: Diagnosis not present

## 2023-05-11 DIAGNOSIS — Z6841 Body Mass Index (BMI) 40.0 and over, adult: Secondary | ICD-10-CM | POA: Diagnosis not present

## 2023-05-11 DIAGNOSIS — M1711 Unilateral primary osteoarthritis, right knee: Secondary | ICD-10-CM | POA: Diagnosis not present

## 2023-05-11 DIAGNOSIS — G8918 Other acute postprocedural pain: Secondary | ICD-10-CM | POA: Diagnosis not present

## 2023-05-11 DIAGNOSIS — E119 Type 2 diabetes mellitus without complications: Secondary | ICD-10-CM | POA: Diagnosis not present

## 2023-05-11 DIAGNOSIS — W182XXA Fall in (into) shower or empty bathtub, initial encounter: Secondary | ICD-10-CM | POA: Diagnosis not present

## 2023-05-11 DIAGNOSIS — Z794 Long term (current) use of insulin: Secondary | ICD-10-CM | POA: Diagnosis not present

## 2023-05-11 DIAGNOSIS — I10 Essential (primary) hypertension: Secondary | ICD-10-CM | POA: Diagnosis not present

## 2023-05-11 DIAGNOSIS — S83241A Other tear of medial meniscus, current injury, right knee, initial encounter: Secondary | ICD-10-CM | POA: Diagnosis not present

## 2023-05-11 DIAGNOSIS — R2689 Other abnormalities of gait and mobility: Secondary | ICD-10-CM | POA: Diagnosis not present

## 2023-05-13 DIAGNOSIS — M25561 Pain in right knee: Secondary | ICD-10-CM | POA: Diagnosis not present

## 2023-05-13 DIAGNOSIS — M25461 Effusion, right knee: Secondary | ICD-10-CM | POA: Diagnosis not present

## 2023-05-13 DIAGNOSIS — M25661 Stiffness of right knee, not elsewhere classified: Secondary | ICD-10-CM | POA: Diagnosis not present

## 2023-05-13 DIAGNOSIS — R2689 Other abnormalities of gait and mobility: Secondary | ICD-10-CM | POA: Diagnosis not present

## 2023-05-20 DIAGNOSIS — M25661 Stiffness of right knee, not elsewhere classified: Secondary | ICD-10-CM | POA: Diagnosis not present

## 2023-05-20 DIAGNOSIS — M25561 Pain in right knee: Secondary | ICD-10-CM | POA: Diagnosis not present

## 2023-05-20 DIAGNOSIS — M25461 Effusion, right knee: Secondary | ICD-10-CM | POA: Diagnosis not present

## 2023-05-20 DIAGNOSIS — R2689 Other abnormalities of gait and mobility: Secondary | ICD-10-CM | POA: Diagnosis not present

## 2023-05-27 DIAGNOSIS — M25661 Stiffness of right knee, not elsewhere classified: Secondary | ICD-10-CM | POA: Diagnosis not present

## 2023-05-27 DIAGNOSIS — R2689 Other abnormalities of gait and mobility: Secondary | ICD-10-CM | POA: Diagnosis not present

## 2023-05-27 DIAGNOSIS — M25461 Effusion, right knee: Secondary | ICD-10-CM | POA: Diagnosis not present

## 2023-05-27 DIAGNOSIS — M25561 Pain in right knee: Secondary | ICD-10-CM | POA: Diagnosis not present

## 2023-05-31 NOTE — Progress Notes (Incomplete)
[facility]  [address] [place],  [place]  [address] [phone]  Clinic Day:  06/06/22  Referring physician: Physicians, Cheryln Manly F*  ASSESSMENT & PLAN:   Iron deficiency anemia Leah Mendoza is a 47 y.o. female  with a longstanding history of iron deficiency anemia with associated reactive thrombocytosis due to menorrhagia.  Her last IV iron was in February.  At her last visit her iron levels were quite low but her hemoglobin was still normal.  It has now dropped to 11.5 and she is quite symptomatic.  I do think she needs iron again.    Folate deficiency The patient's folic acid came back mildly low and she was advised her to start over-the-counter folate acid 800 mcg daily.  We will plan to repeat a folate level at her next visit.   Plan: Her last IV iron was Venofer in February 2023.  She is fairly symptomatic and her hemoglobin is dropping.  She is clearly iron deficient again and so we will proceed with IV Venofer.  I will plan to see her back in 6 months with CBC, comprehensive metabolic profile, iron, TIBC and ferritin levels.  The patient understands the plans discussed today and is in agreement with them.  She knows to contact our office if she develops concerns prior to her next appointment.   Gerline Legacy [facility] AT [facility] [address] [place] [address] Dept: [phone] Dept Fax: [phone]   No orders of the defined types were placed in this encounter.     CHIEF COMPLAINT:  CC: Chronic iron deficiency anemia due to menorrhagia  Current Treatment: IV iron replacement as needed  HISTORY OF PRESENT ILLNESS:  Leah Mendoza is a 47 y.o. female with microcytic anemia secondary to iron deficiency with associated reactive thrombocytosis.  This has been an ongoing problem for years, felt to be due to menorrhagia.  She does not tolerate  oral iron supplementation, so receives IV iron on an as needed basis.  She has been referred to gynecology in the past, but apparently there are financial issues.  She had an EGD in April 2013 with Dr. Jennye Boroughs for upper abdominal pain, which did not reveal any abnormality.  She had colonoscopy with Dr. Charm Barges in June 2015, which was also normal.  Routine screening colonoscopy at age 51 was recommended, but now that will changed age 14, or 89 which would be 10 years from the last.  She has received numerous IV iron infusions over the years when she becomes more anemic and symptomatic.  CT abdomen and pelvis in June 2015 revealed endometrial thickening, for which we referred her to [facility] in June 2015, but she never went.  Repeat CT abdomen and pelvis and February 2016 in the emergency department revealed a left adnexal soft tissue density most consistent with an ovarian cyst.  Pelvic ultrasound was recommended and revealed bilateral ovarian cysts.  The endometrium looked normal on the ultrasound.  She received IV iron again in February and March 2021 due to recurrent iron deficiency. Bilateral screening mammogram in July 2021 did not reveal any evidence of malignancy.  She received IV iron in October 2021.  She had recurrent iron deficiency again in February 2022.  INTERVAL HISTORY:  Leah Mendoza is here today for repeat clinical assessment of her chronic iron deficiency.  She was seen in February 2023 and scheduled for IV iron.  Labs from April clearly show significant iron deficiency but her hemoglobin was 12.1 at that time.  She now feels constant fatigue, more than usual.  Her hemoglobin has dropped to 11.5 and her platelet count is up to 463,000.  CMP is normal.  She continues to report heavy menses usually 5 days a month with clotting.  She did finally have her mammogram in January and this was clear.  She denies melena or hematochezia.  She denies fevers or chills. She denies pain. Her appetite is  good. Her weight has increased 9 pounds over last 3 months .  Her last colonoscopy in June 2015 with Dr. Charm Barges was normal.    REVIEW OF SYSTEMS:  Review of Systems  Constitutional:  Positive for fatigue. Negative for appetite change, chills, diaphoresis, fever and unexpected weight change.  HENT:  Negative.  Negative for hearing loss, lump/mass, mouth sores, nosebleeds, sore throat, tinnitus, trouble swallowing and voice change.   Eyes: Negative.  Negative for eye problems and icterus.  Respiratory: Negative.  Negative for chest tightness, cough, hemoptysis, shortness of breath and wheezing.   Cardiovascular: Negative.  Negative for chest pain, leg swelling and palpitations.  Gastrointestinal: Negative.  Negative for abdominal distention, abdominal pain, blood in stool, constipation, diarrhea, nausea, rectal pain and vomiting.  Endocrine: Negative.  Negative for hot flashes.  Genitourinary: Negative.  Negative for bladder incontinence, difficulty urinating, dyspareunia, dysuria, frequency, hematuria, menstrual problem, nocturia, pelvic pain, vaginal bleeding and vaginal discharge.   Musculoskeletal: Negative.  Negative for arthralgias, back pain, flank pain, gait problem, myalgias, neck pain and neck stiffness.  Skin: Negative.  Negative for itching, rash and wound.  Neurological:  Negative for dizziness, extremity weakness, gait problem, headaches, light-headedness, numbness, seizures and speech difficulty.  Hematological: Negative.  Negative for adenopathy. Does not bruise/bleed easily.  Psychiatric/Behavioral: Negative.  Negative for confusion, decreased concentration, depression, sleep disturbance and suicidal ideas. The patient is not nervous/anxious.      VITALS:  There were no vitals taken for this visit.  Wt Readings from Last 3 Encounters:  12/07/22 (!) 388 lb 6.4 oz (176.2 kg)  10/29/22 (!) 390 lb 4 oz (177 kg)  06/29/22 (!) 393 lb (178.3 kg)    There is no height or weight on  file to calculate BMI.  Performance status (ECOG): 1 - Symptomatic but completely ambulatory  PHYSICAL EXAM:  Physical Exam Vitals and nursing note reviewed.  Constitutional:      General: She is not in acute distress.    Appearance: Normal appearance. She is normal weight. She is not ill-appearing, toxic-appearing or diaphoretic.  HENT:     Head: Normocephalic and atraumatic.     Right Ear: Tympanic membrane, ear canal and external ear normal. There is no impacted cerumen.     Left Ear: Tympanic membrane, ear canal and external ear normal. There is no impacted cerumen.     Nose: Nose normal. No congestion or rhinorrhea.     Mouth/Throat:     Mouth: Mucous membranes are moist.     Pharynx: Oropharynx is clear. No oropharyngeal exudate or posterior oropharyngeal erythema.  Eyes:     General: No scleral icterus.       Right eye: No discharge.        Left eye: No discharge.     Extraocular Movements: Extraocular movements intact.     Conjunctiva/sclera: Conjunctivae normal.     Pupils: Pupils are equal, round, and reactive to light.  Neck:     Vascular:  No carotid bruit.  Cardiovascular:     Rate and Rhythm: Normal rate and regular rhythm.     Pulses: Normal pulses.     Heart sounds: Normal heart sounds. No murmur heard.    No friction rub. No gallop.  Pulmonary:     Effort: Pulmonary effort is normal. No respiratory distress.     Breath sounds: Normal breath sounds. No stridor. No wheezing, rhonchi or rales.  Chest:     Chest wall: No tenderness.  Abdominal:     General: Bowel sounds are normal. There is no distension.     Palpations: Abdomen is soft. There is no hepatomegaly, splenomegaly or mass.     Tenderness: There is no abdominal tenderness. There is no right CVA tenderness, left CVA tenderness, guarding or rebound.     Hernia: No hernia is present.  Musculoskeletal:        General: Normal range of motion.     Cervical back: Normal range of motion and neck supple. No  rigidity or tenderness.     Right lower leg: No edema.     Left lower leg: No edema.  Lymphadenopathy:     Cervical: No cervical adenopathy.     Right cervical: No superficial, deep or posterior cervical adenopathy.    Left cervical: No superficial, deep or posterior cervical adenopathy.     Upper Body:     Right upper body: No supraclavicular, axillary or pectoral adenopathy.     Left upper body: No supraclavicular, axillary or pectoral adenopathy.     Lower Body: No right inguinal adenopathy. No left inguinal adenopathy.  Skin:    General: Skin is warm and dry.     Coloration: Skin is not jaundiced.     Findings: No rash.  Neurological:     General: No focal deficit present.     Mental Status: She is alert and oriented to person, place, and time. Mental status is at baseline.     Cranial Nerves: No cranial nerve deficit.  Psychiatric:        Mood and Affect: Mood normal.        Behavior: Behavior normal.        Thought Content: Thought content normal.        Judgment: Judgment normal.     LABS:      Latest Ref Rng & Units 12/07/2022   12:00 AM 09/26/2022    3:55 PM 06/06/2022   12:00 AM  CBC  WBC  10.3     11.4  9.9      Hemoglobin 12.0 - 16.0 14.4     13.1  11.5      Hematocrit 36 - 46 44     41.0  37      Platelets 150 - 400 K/uL 334     398  463         This result is from an external source.      Latest Ref Rng & Units 12/07/2022    2:08 PM 06/06/2022   12:00 AM 02/21/2022   12:00 AM  CMP  Glucose 70 - 99 mg/dL 161     BUN 6 - 20 mg/dL 17  9     16       Creatinine 0.44 - 1.00 mg/dL 0.96  0.7     0.8      Sodium 135 - 145 mmol/L 135  137     138      Potassium 3.5 - 5.1 mmol/L  4.1  3.8     4.1      Chloride 98 - 111 mmol/L 97  101     102      CO2 22 - 32 mmol/L 27  29     30       Calcium 8.9 - 10.3 mg/dL 9.4  8.7     8.9      Total Protein 6.5 - 8.1 g/dL 7.6     Total Bilirubin 0.3 - 1.2 mg/dL 0.3     Alkaline Phos 38 - 126 U/L 82  74     78      AST 15 - 41  U/L 18  28     31       ALT 0 - 44 U/L 19  23     28          This result is from an external source.   No results found for: "TOTALPROTELP", "ALBUMINELP", "A1GS", "A2GS", "BETS", "BETA2SER", "GAMS", "MSPIKE", "SPEI" Lab Results  Component Value Date   TIBC 351 12/07/2022   TIBC 441 09/26/2022   TIBC 440 06/06/2022   FERRITIN 89 12/07/2022   FERRITIN 6 (L) 09/26/2022   FERRITIN 3 (L) 06/06/2022   IRONPCTSAT 26 12/07/2022   IRONPCTSAT 11 09/26/2022   IRONPCTSAT 9 (L) 06/06/2022   No results found for: "LDH"  STUDIES:     HISTORY:   Past Medical History:  Diagnosis Date   Asthma    Folate deficiency 11/24/2021   Hypertension    Iron deficiency anemia 08/25/2020   Iron deficiency anemia secondary to blood loss (chronic)     Past Surgical History:  Procedure Laterality Date   CHOLECYSTECTOMY      Family History  Adopted: Yes    Social History:  reports that she has never smoked. She has never used smokeless tobacco. She reports that she does not use drugs. No history on file for alcohol use.The patient is alone today.  Allergies:  Allergies  Allergen Reactions   Latex     UNKNOWN    Current Medications: Current Outpatient Medications  Medication Sig Dispense Refill   doxycycline (MONODOX) 100 MG capsule Take 1 capsule (100 mg total) by mouth 2 (two) times daily. 14 capsule 0   Folic Acid 0.8 MG CAPS      No current facility-administered medications for this visit.      I,Jasmine M Lassiter,acting as a scribe for Dellia Beckwith, MD.,have documented all relevant documentation on the behalf of Dellia Beckwith, MD,as directed by  Dellia Beckwith, MD while in the presence of Dellia Beckwith, MD.

## 2023-06-03 DIAGNOSIS — M25661 Stiffness of right knee, not elsewhere classified: Secondary | ICD-10-CM | POA: Diagnosis not present

## 2023-06-03 DIAGNOSIS — R2689 Other abnormalities of gait and mobility: Secondary | ICD-10-CM | POA: Diagnosis not present

## 2023-06-03 DIAGNOSIS — M25461 Effusion, right knee: Secondary | ICD-10-CM | POA: Diagnosis not present

## 2023-06-03 DIAGNOSIS — M25561 Pain in right knee: Secondary | ICD-10-CM | POA: Diagnosis not present

## 2023-06-06 NOTE — Assessment & Plan Note (Signed)
Folate deficiency resolved with oral supplementation.  She has discontinued folic acid on her own, so we will repeat a folate level today.

## 2023-06-06 NOTE — Assessment & Plan Note (Addendum)
Longstanding history of iron deficiency anemia with associated reactive thrombocytosis due to menorrhagia.  Has periodically received IV iron.  Her hemoglobin and platelets are normal today.  Iron studies are pending.  If her iron stores remain adequate, she will not require IV iron at this time.  I will plan to see her back in 6 months for repeat clinical assessment.

## 2023-06-06 NOTE — Progress Notes (Signed)
[facility]  [address] [place],  [place]  [address] [phone]     Clinic Day:  06/10/2023  Referring physician: Physicians, Cheryln Manly F*  ASSESSMENT & PLAN:   Assessment & Plan: Iron deficiency anemia Longstanding history of iron deficiency anemia with associated reactive thrombocytosis due to menorrhagia.  Her ferritin had dropped to 6, so she received IV iron again in December.  Folate deficiency Folate deficiency resolved with oral supplementation, which she will continue.    The patient understands the plans discussed today and is in agreement with them.  She knows to contact our office if she develops concerns prior to her next appointment.     Adah Perl, PA-C  [facility] AT [facility] [address] [place] [address] Dept: [phone] Dept Fax: [phone]   No orders of the defined types were placed in this encounter.     CHIEF COMPLAINT:  CC: Iron deficiency  Current Treatment: IV iron as needed  HISTORY OF PRESENT ILLNESS:  Leah Mendoza is a 47 y.o. female with microcytic anemia secondary to iron deficiency with associated reactive thrombocytosis.  This has been an ongoing problem for years, felt to be due to menorrhagia.  She does not tolerate oral iron supplementation, so receives IV iron on an as needed basis.  She has been referred to gynecology in the past, but apparently there are financial issues.  She had an EGD in April 2013 with Dr. Jennye Boroughs for upper abdominal pain, which did not reveal any abnormality.  She had colonoscopy with Dr. Charm Barges in June 2015, which was also normal.  Routine screening colonoscopy at age 19 was recommended, but now that has changed to age 54, so she likely will have repeat screening colonoscopy at age 8, which would be 10 years from the last.  She has received numerous IV iron infusions over the years when  she becomes more anemic and symptomatic.  CT abdomen and pelvis in June 2015 revealed endometrial thickening, for which we referred her to [facility] in June 2015, but she never went.  Repeat CT abdomen and pelvis and February 2016 in the emergency department revealed a left adnexal soft tissue density most consistent with an ovarian cyst.  Pelvic ultrasound was recommended and revealed bilateral ovarian cysts. The endometrium looked normal on the ultrasound.    She has required IV iron regularly over the years.  She received Venofer in August 2023. She contacted our office in November, as she was not feeling well.  Her ferritin dropped to 6, so she received IV Venofer again in December.  Bilateral screening mammogram in January 2023 did not reveal any evidence of malignancy.  INTERVAL HISTORY:  Leah Mendoza is here today for repeat clinical assessment.  She last had IV iron in December.  In January, her hemoglobin was normal and iron stores adequate. She continues to report fatigue.   denies fevers or chills. She denies pain. Her appetite is good. Her weight has decreased 2 pounds over last month .  I ordered a bilateral screening mammogram in January, but currently this was not scheduled.  Review of her records reveal she was hospitalized at [facility] in February with diabetic ketoacidosis.  She was in the ER at [facility] in April for right knee pain.  She had a right knee effusion as well as asymptomatic hypertension.  She was seen by Dr. Deberah Castle in orthopedic surgery.  Right  knee MRI in May revealed a small horizontal tear in the medial meniscus.  She underwent arthroscopic medial meniscus repair in June.  REVIEW OF SYSTEMS:  Review of Systems - Oncology   VITALS:  There were no vitals taken for this visit.  Wt Readings from Last 3 Encounters:  12/07/22 (!) 388 lb 6.4 oz (176.2 kg)  10/29/22 (!) 390 lb 4 oz (177 kg)  06/29/22 (!) 393 lb (178.3 kg)    There is no height or weight on  file to calculate BMI.  Performance status (ECOG): 1 - Symptomatic but completely ambulatory  PHYSICAL EXAM:  Physical Exam  LABS:      Latest Ref Rng & Units 12/07/2022   12:00 AM 09/26/2022    3:55 PM 06/06/2022   12:00 AM  CBC  WBC  10.3     11.4  9.9      Hemoglobin 12.0 - 16.0 14.4     13.1  11.5      Hematocrit 36 - 46 44     41.0  37      Platelets 150 - 400 K/uL 334     398  463         This result is from an external source.      Latest Ref Rng & Units 12/07/2022    2:08 PM 06/06/2022   12:00 AM 02/21/2022   12:00 AM  CMP  Glucose 70 - 99 mg/dL 865     BUN 6 - 20 mg/dL 17  9     16       Creatinine 0.44 - 1.00 mg/dL 7.84  0.7     0.8      Sodium 135 - 145 mmol/L 135  137     138      Potassium 3.5 - 5.1 mmol/L 4.1  3.8     4.1      Chloride 98 - 111 mmol/L 97  101     102      CO2 22 - 32 mmol/L 27  29     30       Calcium 8.9 - 10.3 mg/dL 9.4  8.7     8.9      Total Protein 6.5 - 8.1 g/dL 7.6     Total Bilirubin 0.3 - 1.2 mg/dL 0.3     Alkaline Phos 38 - 126 U/L 82  74     78      AST 15 - 41 U/L 18  28     31       ALT 0 - 44 U/L 19  23     28          This result is from an external source.     No results found for: "CEA1", "CEA" / No results found for: "CEA1", "CEA" No results found for: "PSA1" No results found for: "ONG295" No results found for: "CAN125"  No results found for: "TOTALPROTELP", "ALBUMINELP", "A1GS", "A2GS", "BETS", "BETA2SER", "GAMS", "MSPIKE", "SPEI"  Lab Results  Component Value Date   TIBC 351 12/07/2022   TIBC 441 09/26/2022   TIBC 440 06/06/2022   FERRITIN 89 12/07/2022   FERRITIN 6 (L) 09/26/2022   FERRITIN 3 (L) 06/06/2022   IRONPCTSAT 26 12/07/2022   IRONPCTSAT 11 09/26/2022   IRONPCTSAT 9 (L) 06/06/2022   No results found for: "LDH"  STUDIES:  No results found.    HISTORY:   Past Medical History:  Diagnosis Date   Asthma    Folate  deficiency 11/24/2021   Hypertension    Iron deficiency anemia 08/25/2020   Iron  deficiency anemia secondary to blood loss (chronic)     Past Surgical History:  Procedure Laterality Date   CHOLECYSTECTOMY      Family History  Adopted: Yes    Social History:  reports that she has never smoked. She has never used smokeless tobacco. She reports that she does not use drugs. No history on file for alcohol use.The patient is alone today.  Allergies:  Allergies  Allergen Reactions   Latex     UNKNOWN    Current Medications: Current Outpatient Medications  Medication Sig Dispense Refill   doxycycline (MONODOX) 100 MG capsule Take 1 capsule (100 mg total) by mouth 2 (two) times daily. 14 capsule 0   Folic Acid 0.8 MG CAPS      No current facility-administered medications for this visit.

## 2023-06-07 ENCOUNTER — Ambulatory Visit: Payer: BC Managed Care – PPO | Admitting: Oncology

## 2023-06-07 ENCOUNTER — Other Ambulatory Visit: Payer: BC Managed Care – PPO

## 2023-06-10 ENCOUNTER — Encounter: Payer: Self-pay | Admitting: Hematology and Oncology

## 2023-06-10 ENCOUNTER — Inpatient Hospital Stay (INDEPENDENT_AMBULATORY_CARE_PROVIDER_SITE_OTHER): Payer: BC Managed Care – PPO | Admitting: Hematology and Oncology

## 2023-06-10 ENCOUNTER — Inpatient Hospital Stay: Payer: BC Managed Care – PPO | Attending: Hematology and Oncology

## 2023-06-10 ENCOUNTER — Telehealth: Payer: Self-pay | Admitting: Hematology and Oncology

## 2023-06-10 VITALS — BP 149/103 | HR 78 | Temp 98.1°F | Resp 18 | Ht 60.0 in | Wt 367.2 lb

## 2023-06-10 DIAGNOSIS — D500 Iron deficiency anemia secondary to blood loss (chronic): Secondary | ICD-10-CM | POA: Diagnosis not present

## 2023-06-10 DIAGNOSIS — E538 Deficiency of other specified B group vitamins: Secondary | ICD-10-CM

## 2023-06-10 DIAGNOSIS — Z1231 Encounter for screening mammogram for malignant neoplasm of breast: Secondary | ICD-10-CM

## 2023-06-10 DIAGNOSIS — I10 Essential (primary) hypertension: Secondary | ICD-10-CM | POA: Insufficient documentation

## 2023-06-10 DIAGNOSIS — D509 Iron deficiency anemia, unspecified: Secondary | ICD-10-CM | POA: Diagnosis not present

## 2023-06-10 LAB — CBC AND DIFFERENTIAL
HCT: 44 (ref 36–46)
Hemoglobin: 15.1 (ref 12.0–16.0)
MCV: 87 (ref 81–99)
Neutrophils Absolute: 6.09
Platelets: 346 10*3/uL (ref 150–400)
WBC: 8.7

## 2023-06-10 LAB — IRON AND TIBC
Iron: 81 ug/dL (ref 28–170)
Saturation Ratios: 23 % (ref 10.4–31.8)
TIBC: 360 ug/dL (ref 250–450)
UIBC: 279 ug/dL

## 2023-06-10 LAB — FOLATE: Folate: 5.5 ng/mL — ABNORMAL LOW (ref >5.9)

## 2023-06-10 LAB — FERRITIN: Ferritin: 34 ng/mL (ref 11–307)

## 2023-06-10 LAB — CBC: RBC: 5.08 (ref 3.87–5.11)

## 2023-06-10 NOTE — Assessment & Plan Note (Signed)
Significant hypertension while hospitalized in June.  Her blood pressure tends to fluctuate up and down, but remains elevated today.  I asked her to monitor her blood pressure at home and schedule a follow-up visit with Dr. Yetta Flock.

## 2023-06-10 NOTE — Telephone Encounter (Signed)
06/10/23 Spoke with patient and confirmed next appt

## 2023-06-11 ENCOUNTER — Telehealth: Payer: Self-pay

## 2023-06-11 NOTE — Telephone Encounter (Signed)
-----   Message from Adah Perl sent at 06/10/2023  2:36 PM EDT ----- Please let her know her hemoglobin is normal and iron stores good, but decreased from previous, so I will plan to see her in 3 months. Also, her folic acid is a low again. I recommend she continue daily folic acid 800 mcg indefinitely. Thank you.

## 2023-06-11 NOTE — Telephone Encounter (Signed)
Patient notified and voiced understanding.

## 2023-09-09 NOTE — Progress Notes (Deleted)
[facility]  [address] [place],  [place]  [address] [phone]  Clinic Day:  09/09/2023  Referring physician: Abner Greenspan, MD  ASSESSMENT & PLAN:   Assessment & Plan: No problem-specific Assessment & Plan notes found for this encounter.    The patient understands the plans discussed today and is in agreement with them.  She knows to contact our office if she develops concerns prior to her next appointment.   I provided *** minutes of face-to-face time during this encounter and > 50% was spent counseling as documented under my assessment and plan.    Adah Perl, PA-C  [facility] AT [facility] [address] [place] [address] Dept: [phone] Dept Fax: [phone]   No orders of the defined types were placed in this encounter.     CHIEF COMPLAINT:  CC: ***  Current Treatment:  ***  HISTORY OF PRESENT ILLNESS:   Oncology History   No history exists.      INTERVAL HISTORY:  Salema is here today for repeat clinical assessment. She denies fevers or chills. She denies pain. Her appetite is good. Her weight {Weight change:10426}.  REVIEW OF SYSTEMS:  Review of Systems - Oncology   VITALS:  There were no vitals taken for this visit.  Wt Readings from Last 3 Encounters:  06/10/23 (!) 367 lb 3.2 oz (166.6 kg)  12/07/22 (!) 388 lb 6.4 oz (176.2 kg)  10/29/22 (!) 390 lb 4 oz (177 kg)    There is no height or weight on file to calculate BMI.  Performance status (ECOG): {CHL ONC [id number]  PHYSICAL EXAM:  Physical Exam  LABS:      Latest Ref Rng & Units 06/10/2023   12:00 AM 12/07/2022   12:00 AM 09/26/2022    3:55 PM  CBC  WBC  8.7     10.3     11.4   Hemoglobin 12.0 - 16.0 15.1     14.4     13.1   Hematocrit 36 - 46 44     44     41.0   Platelets 150 - 400 K/uL 346     334     398      This result is from an external source.      Latest Ref Rng  & Units 12/07/2022    2:08 PM 06/06/2022   12:00 AM 02/21/2022   12:00 AM  CMP  Glucose 70 - 99 mg/dL 027     BUN 6 - 20 mg/dL 17  9     16       Creatinine 0.44 - 1.00 mg/dL 2.53  0.7     0.8      Sodium 135 - 145 mmol/L 135  137     138      Potassium 3.5 - 5.1 mmol/L 4.1  3.8     4.1      Chloride 98 - 111 mmol/L 97  101     102      CO2 22 - 32 mmol/L 27  29     30       Calcium 8.9 - 10.3 mg/dL 9.4  8.7     8.9      Total Protein 6.5 - 8.1 g/dL 7.6     Total Bilirubin 0.3 - 1.2 mg/dL 0.3     Alkaline Phos 38 - 126 U/L 82  74     78  AST 15 - 41 U/L 18  28     31       ALT 0 - 44 U/L 19  23     28          This result is from an external source.     No results found for: "CEA1", "CEA" / No results found for: "CEA1", "CEA" No results found for: "PSA1" No results found for: "QMV784" No results found for: "CAN125"  No results found for: "TOTALPROTELP", "ALBUMINELP", "A1GS", "A2GS", "BETS", "BETA2SER", "GAMS", "MSPIKE", "SPEI" Lab Results  Component Value Date   TIBC 360 06/10/2023   TIBC 351 12/07/2022   TIBC 441 09/26/2022   FERRITIN 34 06/10/2023   FERRITIN 89 12/07/2022   FERRITIN 6 (L) 09/26/2022   IRONPCTSAT 23 06/10/2023   IRONPCTSAT 26 12/07/2022   IRONPCTSAT 11 09/26/2022   No results found for: "LDH"  STUDIES:  No results found.    HISTORY:   Past Medical History:  Diagnosis Date   Asthma    Folate deficiency 11/24/2021   Hypertension    Iron deficiency anemia 08/25/2020   Iron 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 She has never used smokeless tobacco. She reports that she does not use drugs. No history on file for alcohol use.The patient is {Blank single:19197::"alone","accompanied by"} *** today.  Allergies:   Allergies  Allergen Reactions   Latex     UNKNOWN    Current Medications: Current Outpatient Medications  Medication Sig Dispense Refill   aspirin EC (BAYER LOW DOSE) 81 MG tablet Take 81 mg by mouth daily.     LANTUS SOLOSTAR 100 UNIT/ML Solostar Pen Inject 10 Units into the skin at bedtime.     oxyCODONE (OXY IR/ROXICODONE) 5 MG immediate release tablet Take 5 mg by mouth every 6 (six) hours as needed. for pain     No current facility-administered medications for this visit.

## 2023-09-10 ENCOUNTER — Inpatient Hospital Stay: Payer: BC Managed Care – PPO

## 2023-09-10 ENCOUNTER — Inpatient Hospital Stay: Payer: BC Managed Care – PPO | Admitting: Hematology and Oncology

## 2023-09-10 DIAGNOSIS — M25461 Effusion, right knee: Secondary | ICD-10-CM | POA: Diagnosis not present

## 2023-09-16 NOTE — Progress Notes (Signed)
[facility]  [address] [place],  [place]  [address] [phone]  Clinic Day:  09/17/2023  Referring physician: Abner Greenspan, MD  ASSESSMENT & PLAN:   Assessment & Plan: Iron deficiency anemia Longstanding history of iron deficiency anemia with associated reactive thrombocytosis due to menorrhagia.  She has periodically received IV iron, as she does not tolerate oral iron.  Her hemoglobin remains normal, but she has recurrent reactive thrombocytosis. Her iron stores are low, so will schedule her for IV iron in the upcoming days. I asked her to try a multivitamin with iron to see if she can tolerate low dose iron.   I will refer her back to gynecology for further recommendations regarding her menorrhagia. Also, due to her age, she should have a screening colonoscopy, so will refer her to Dr. Jennye Boroughs for that. I will plan to see her back in 6 weeks with a CBC and ferritin.  Folate deficiency Folate deficiency resolved with oral supplementation.  She knows to continue this.  Leukocytosis Mild leukocytosis/neutrophilia, likely reactive. Will continue to monitor.    The patient understands the plans discussed today and is in agreement with them.  She knows to contact our office if she develops concerns prior to her next appointment.   I provided 40 minutes of face-to-face time during this encounter and > 50% was spent counseling as documented under my assessment and plan.    Adah Perl, PA-C  [facility] [address] [place] [address] Dept: [phone] Dept Fax: [phone]   No orders of the defined types were placed in this encounter.     CHIEF COMPLAINT:  CC: Iron deficiency  Current Treatment: IV iron as needed  HISTORY OF PRESENT ILLNESS:  Leah Mendoza is a 47 y.o. female with microcytic anemia secondary to iron deficiency with associated  reactive thrombocytosis who we began seeing in August 2014.  This has been an ongoing problem for years, felt to be due to menorrhagia.  She does not tolerate oral iron supplementation, so receives IV iron on an as needed basis.  She has been referred to gynecology in the past, but apparently there are financial issues.  She had an EGD in April 2013 with Dr. Jennye Boroughs for upper abdominal pain, which did not reveal any abnormality.  She had colonoscopy with Dr. Charm Barges in June 2015, which was also normal.  Routine screening colonoscopy at age 11 was recommended, but now that has changed to age 20, so she likely will have repeat screening colonoscopy at age 67, which would be 10 years from the last.  She has received numerous IV iron infusions over the years when she becomes more anemic and symptomatic.  CT abdomen and pelvis in June 2015 revealed endometrial thickening, for which we referred her to [facility] in June 2015, but she never went.  Repeat CT abdomen and pelvis and February 2016 in the emergency department revealed a left adnexal soft tissue density most consistent with an ovarian cyst.  Pelvic ultrasound was recommended and revealed bilateral ovarian cysts. The endometrium looked normal on the ultrasound.     She has required IV iron regularly over the years.  She received Venofer in August 2023. She contacted our office in November, as she was not feeling well.  Her ferritin dropped to 6, so she received IV Venofer again in December.  Bilateral screening mammogram in January 2023 did not reveal  any evidence of malignancy.  In January, her hemoglobin was normal and iron stores adequate.  INTERVAL HISTORY:  Leah Mendoza is here today for repeat clinical assessment.  She continues to report fatigue not relieved with rest.  She reports mild pica to ice.  She denies shortness of breath or lightheadedness.  She continues to have heavy menses.  In the past month she had bleeding for 7 days, 5 days of  which were heavy and 2 weeks later had 5 days of bleeding, 2-3 of which were heavy.  She has not been back to see her gynecologist.  She denies other overt form of blood loss.  She denies fevers or chills. She denies pain. Her appetite is good. Her weight has increased 5 pounds over last 3 months .  She is overdue for bilateral screening mammogram, so I will try to schedule that again.  She has never had a screening colonoscopy, so we will refer her to Dr. Jennye Boroughs.  She has not been taking her insulin, so we advised her to see her PCP.  REVIEW OF SYSTEMS:  Review of Systems  Constitutional:  Positive for fatigue. Negative for appetite change, chills, fever and unexpected weight change.  HENT:   Negative for lump/mass, mouth sores, nosebleeds and sore throat.   Respiratory:  Negative for cough, hemoptysis and shortness of breath.   Cardiovascular:  Negative for chest pain and leg swelling.  Gastrointestinal:  Negative for abdominal pain, blood in stool, constipation, diarrhea, nausea, rectal pain and vomiting.  Genitourinary:  Positive for menstrual problem. Negative for difficulty urinating, dysuria, frequency and hematuria.   Musculoskeletal:  Negative for arthralgias, back pain, gait problem and myalgias.  Skin:  Negative for itching, rash and wound.  Neurological:  Negative for dizziness, gait problem, headaches and light-headedness.  Hematological:  Negative for adenopathy. Does not bruise/bleed easily.  Psychiatric/Behavioral:  Negative for depression and sleep disturbance. The patient is not nervous/anxious.      VITALS:  Blood pressure (!) 155/95, pulse 84, temperature 98.1 F (36.7 C), temperature source Oral, resp. rate 20, height 5' 1.52" (1.563 m), weight (!) 372 lb 14.4 oz (169.1 kg), last menstrual period 08/25/2023, SpO2 99%.  Wt Readings from Last 3 Encounters:  09/17/23 (!) 372 lb 14.4 oz (169.1 kg)  06/10/23 (!) 367 lb 3.2 oz (166.6 kg)  12/07/22 (!) 388 lb 6.4 oz (176.2  kg)    Body mass index is 69.26 kg/m.  Performance status (ECOG): 1 - Symptomatic but completely ambulatory  PHYSICAL EXAM:  Physical Exam Vitals and nursing note reviewed.  Constitutional:      General: She is not in acute distress.    Appearance: She is obese.  HENT:     Head: Normocephalic and atraumatic.     Mouth/Throat:     Mouth: Mucous membranes are moist.     Pharynx: Oropharynx is clear. No oropharyngeal exudate or posterior oropharyngeal erythema.  Eyes:     General: No scleral icterus.    Extraocular Movements: Extraocular movements intact.     Conjunctiva/sclera: Conjunctivae normal.     Pupils: Pupils are equal, round, and reactive to light.  Cardiovascular:     Rate and Rhythm: Normal rate and regular rhythm.     Heart sounds: Normal heart sounds. No murmur heard.    No friction rub. No gallop.  Pulmonary:     Effort: Pulmonary effort is normal.     Breath sounds: Normal breath sounds. No wheezing, rhonchi or rales.  Abdominal:  General: There is no distension.     Palpations: Abdomen is soft. There is no mass.     Tenderness: There is no abdominal tenderness.     Comments: It is difficult to assess for hepatosplenomegaly due to body habitus  Musculoskeletal:        General: Normal range of motion.     Cervical back: Normal range of motion and neck supple. No tenderness.     Right lower leg: No edema.     Left lower leg: No edema.  Lymphadenopathy:     Cervical: No cervical adenopathy.     Upper Body:     Right upper body: No supraclavicular or axillary adenopathy.     Left upper body: No supraclavicular or axillary adenopathy.  Skin:    General: Skin is warm and dry.     Coloration: Skin is not jaundiced.     Findings: No rash.  Neurological:     Mental Status: She is alert and oriented to person, place, and time.     Cranial Nerves: No cranial nerve deficit.  Psychiatric:        Mood and Affect: Mood normal.        Behavior: Behavior normal.         Thought Content: Thought content normal.    LABS:      Latest Ref Rng & Units 09/17/2023    2:27 PM 06/10/2023   12:00 AM 12/07/2022   12:00 AM  CBC  WBC 4.0 - 10.5 K/uL 13.4  8.7     10.3      Hemoglobin 12.0 - 15.0 g/dL 52.8  41.3     24.4      Hematocrit 36.0 - 46.0 % 45.3  44     44      Platelets 150 - 400 K/uL 449  346     334         This result is from an external source.      Latest Ref Rng & Units 09/17/2023    2:27 PM 12/07/2022    2:08 PM 06/06/2022   12:00 AM  CMP  Glucose 70 - 99 mg/dL 010  272    BUN 6 - 20 mg/dL 15  17  9       Creatinine 0.44 - 1.00 mg/dL 5.36  6.44  0.7      Sodium 135 - 145 mmol/L 136  135  137      Potassium 3.5 - 5.1 mmol/L 4.2  4.1  3.8      Chloride 98 - 111 mmol/L 97  97  101      CO2 22 - 32 mmol/L 29  27  29       Calcium 8.9 - 10.3 mg/dL 9.3  9.4  8.7      Total Protein 6.5 - 8.1 g/dL 8.2  7.6    Total Bilirubin 0.3 - 1.2 mg/dL 0.6  0.3    Alkaline Phos 38 - 126 U/L 85  82  74      AST 15 - 41 U/L 13  18  28       ALT 0 - 44 U/L 20  19  23          This result is from an external source.     No results found for: "CEA1", "CEA" / No results found for: "CEA1", "CEA" No results found for: "PSA1" No results found for: "IHK742" No results found for: "CAN125"  No results found for: "  TOTALPROTELP", "ALBUMINELP", "A1GS", "A2GS", "BETS", "BETA2SER", "GAMS", "MSPIKE", "SPEI" Lab Results  Component Value Date   TIBC 455 (H) 09/17/2023   TIBC 360 06/10/2023   TIBC 351 12/07/2022   FERRITIN 9 (L) 09/17/2023   FERRITIN 34 06/10/2023   FERRITIN 89 12/07/2022   IRONPCTSAT 19 09/17/2023   IRONPCTSAT 23 06/10/2023   IRONPCTSAT 26 12/07/2022   No results found for: "LDH"  STUDIES:  No results found.    HISTORY:   Past Medical History:  Diagnosis Date  . Asthma   . Folate deficiency 11/24/2021  . Hypertension   . Iron deficiency anemia 08/25/2020  . Iron deficiency anemia secondary to blood loss (chronic)     Past  Surgical History:  Procedure Laterality Date  . CHOLECYSTECTOMY    . KNEE ARTHROSCOPY W/ MENISCAL REPAIR Right 05/10/2023    Family History  Adopted: Yes    Social History:  reports that she has never smoked. She has never used smokeless tobacco. She reports that she does not use drugs. No history on file for alcohol use.The patient is alone today.  Allergies:  Allergies  Allergen Reactions  . Latex     UNKNOWN    Current Medications: Current Outpatient Medications  Medication Sig Dispense Refill  . aspirin EC (BAYER LOW DOSE) 81 MG tablet Take 81 mg by mouth daily.    Marland Kitchen LANTUS SOLOSTAR 100 UNIT/ML Solostar Pen Inject 10 Units into the skin at bedtime. (Patient not taking: Reported on 09/17/2023)    . oxyCODONE (OXY IR/ROXICODONE) 5 MG immediate release tablet Take 5 mg by mouth every 6 (six) hours as needed. for pain     No current facility-administered medications for this visit.

## 2023-09-17 ENCOUNTER — Inpatient Hospital Stay: Payer: BC Managed Care – PPO | Attending: Hematology and Oncology

## 2023-09-17 ENCOUNTER — Inpatient Hospital Stay: Payer: BC Managed Care – PPO | Admitting: Hematology and Oncology

## 2023-09-17 ENCOUNTER — Encounter: Payer: Self-pay | Admitting: Hematology and Oncology

## 2023-09-17 VITALS — BP 155/95 | HR 84 | Temp 98.1°F | Resp 20 | Ht 61.52 in | Wt 372.9 lb

## 2023-09-17 DIAGNOSIS — E538 Deficiency of other specified B group vitamins: Secondary | ICD-10-CM | POA: Diagnosis not present

## 2023-09-17 DIAGNOSIS — D500 Iron deficiency anemia secondary to blood loss (chronic): Secondary | ICD-10-CM

## 2023-09-17 DIAGNOSIS — D509 Iron deficiency anemia, unspecified: Secondary | ICD-10-CM | POA: Diagnosis not present

## 2023-09-17 DIAGNOSIS — D72829 Elevated white blood cell count, unspecified: Secondary | ICD-10-CM | POA: Insufficient documentation

## 2023-09-17 DIAGNOSIS — N920 Excessive and frequent menstruation with regular cycle: Secondary | ICD-10-CM | POA: Insufficient documentation

## 2023-09-17 LAB — CMP (CANCER CENTER ONLY)
ALT: 20 U/L (ref 0–44)
AST: 13 U/L — ABNORMAL LOW (ref 15–41)
Albumin: 4.3 g/dL (ref 3.5–5.0)
Alkaline Phosphatase: 85 U/L (ref 38–126)
Anion gap: 10 (ref 5–15)
BUN: 15 mg/dL (ref 6–20)
CO2: 29 mmol/L (ref 22–32)
Calcium: 9.3 mg/dL (ref 8.9–10.3)
Chloride: 97 mmol/L — ABNORMAL LOW (ref 98–111)
Creatinine: 0.74 mg/dL (ref 0.44–1.00)
GFR, Estimated: >60 mL/min (ref >60)
Glucose, Bld: 181 mg/dL — ABNORMAL HIGH (ref 70–99)
Potassium: 4.2 mmol/L (ref 3.5–5.1)
Sodium: 136 mmol/L (ref 135–145)
Total Bilirubin: 0.6 mg/dL (ref 0.3–1.2)
Total Protein: 8.2 g/dL — ABNORMAL HIGH (ref 6.5–8.1)

## 2023-09-17 LAB — CBC WITH DIFFERENTIAL (CANCER CENTER ONLY)
Abs Immature Granulocytes: 0.05 10*3/uL (ref 0.00–0.07)
Basophils Absolute: 0.1 10*3/uL (ref 0.0–0.1)
Basophils Relative: 0 %
Eosinophils Absolute: 0.2 10*3/uL (ref 0.0–0.5)
Eosinophils Relative: 1 %
HCT: 45.3 % (ref 36.0–46.0)
Hemoglobin: 14.3 g/dL (ref 12.0–15.0)
Immature Granulocytes: 0 %
Lymphocytes Relative: 21 %
Lymphs Abs: 2.8 10*3/uL (ref 0.7–4.0)
MCH: 29.4 pg (ref 26.0–34.0)
MCHC: 31.6 g/dL (ref 30.0–36.0)
MCV: 93 fL (ref 80.0–100.0)
Monocytes Absolute: 0.8 10*3/uL (ref 0.1–1.0)
Monocytes Relative: 6 %
Neutro Abs: 9.4 10*3/uL — ABNORMAL HIGH (ref 1.7–7.7)
Neutrophils Relative %: 72 %
Platelet Count: 449 10*3/uL — ABNORMAL HIGH (ref 150–400)
RBC: 4.87 MIL/uL (ref 3.87–5.11)
RDW: 12.9 % (ref 11.5–15.5)
WBC Count: 13.4 10*3/uL — ABNORMAL HIGH (ref 4.0–10.5)
nRBC: 0 % (ref 0.0–0.2)

## 2023-09-17 LAB — IRON AND TIBC
Iron: 85 ug/dL (ref 28–170)
Saturation Ratios: 19 % (ref 10.4–31.8)
TIBC: 455 ug/dL — ABNORMAL HIGH (ref 250–450)
UIBC: 370 ug/dL

## 2023-09-17 LAB — FERRITIN: Ferritin: 9 ng/mL — ABNORMAL LOW (ref 11–307)

## 2023-09-17 LAB — FOLATE: Folate: 6.6 ng/mL (ref >5.9)

## 2023-09-17 NOTE — Assessment & Plan Note (Addendum)
Longstanding history of iron deficiency anemia with associated reactive thrombocytosis due to menorrhagia.  She has periodically received IV iron, as she does not tolerate oral iron.  Her hemoglobin remains normal, but she has recurrent reactive thrombocytosis. Her iron stores are low, so will schedule her for IV iron in the upcoming days. I asked her to try a multivitamin with iron to see if she can tolerate low dose iron.   I will refer her back to gynecology for further recommendations regarding her menorrhagia. Also, due to her age, she should have a screening colonoscopy, so will refer her to Dr. Jennye Boroughs for that. I will plan to see her back in 6 weeks with a CBC and ferritin.

## 2023-09-17 NOTE — Assessment & Plan Note (Signed)
Folate deficiency resolved with oral supplementation.  She knows to continue this.

## 2023-09-18 ENCOUNTER — Telehealth: Payer: Self-pay

## 2023-09-18 ENCOUNTER — Encounter: Payer: Self-pay | Admitting: Hematology and Oncology

## 2023-09-18 ENCOUNTER — Telehealth: Payer: Self-pay | Admitting: Hematology and Oncology

## 2023-09-18 DIAGNOSIS — D72829 Elevated white blood cell count, unspecified: Secondary | ICD-10-CM | POA: Insufficient documentation

## 2023-09-18 NOTE — Addendum Note (Signed)
Addended by: Lianne Bushy on: 09/18/2023 02:54 PM   Modules accepted: Orders

## 2023-09-18 NOTE — Telephone Encounter (Signed)
-----   Message from Adah Perl sent at 09/17/2023  3:25 PM EDT ----- 1. Refer back to Dr. Anastasio Auerbach, gyn, for abnormal uterine bleeding. Patient last seen in 2019. Possibly failed to f/u. 2. Refer to Dr. Jennye Boroughs for screening colonoscopy (iron deficiency due to heavy periods)

## 2023-09-18 NOTE — Assessment & Plan Note (Signed)
Mild leukocytosis/neutrophilia, likely reactive. Will continue to monitor.

## 2023-09-18 NOTE — Telephone Encounter (Signed)
Referrals faxed. 

## 2023-09-18 NOTE — Telephone Encounter (Signed)
09/18/23 Spoke with patient and confirmed mammogram appt on 09/25/23@230pm 

## 2023-09-24 ENCOUNTER — Telehealth: Payer: Self-pay | Admitting: Hematology and Oncology

## 2023-09-24 NOTE — Telephone Encounter (Signed)
09/24/23 Spoke with patient and confirmed next appts.

## 2023-09-25 DIAGNOSIS — Z1231 Encounter for screening mammogram for malignant neoplasm of breast: Secondary | ICD-10-CM | POA: Diagnosis not present

## 2023-09-25 LAB — HM MAMMOGRAPHY

## 2023-09-26 ENCOUNTER — Encounter: Payer: Self-pay | Admitting: Hematology and Oncology

## 2023-09-26 ENCOUNTER — Other Ambulatory Visit: Payer: Self-pay | Admitting: Pharmacist

## 2023-09-26 ENCOUNTER — Inpatient Hospital Stay: Payer: BC Managed Care – PPO | Attending: Hematology and Oncology

## 2023-09-26 VITALS — BP 165/84 | HR 75 | Temp 98.3°F | Resp 18

## 2023-09-26 DIAGNOSIS — D509 Iron deficiency anemia, unspecified: Secondary | ICD-10-CM | POA: Insufficient documentation

## 2023-09-26 DIAGNOSIS — L03113 Cellulitis of right upper limb: Secondary | ICD-10-CM | POA: Insufficient documentation

## 2023-09-26 DIAGNOSIS — N920 Excessive and frequent menstruation with regular cycle: Secondary | ICD-10-CM | POA: Diagnosis not present

## 2023-09-26 DIAGNOSIS — D500 Iron deficiency anemia secondary to blood loss (chronic): Secondary | ICD-10-CM

## 2023-09-26 MED ORDER — SODIUM CHLORIDE 0.9% FLUSH
10.0000 mL | Freq: Two times a day (BID) | INTRAVENOUS | Status: DC
  Administered 2023-09-26: 10 mL via INTRAVENOUS

## 2023-09-26 MED ORDER — IRON SUCROSE 20 MG/ML IV SOLN
200.0000 mg | Freq: Once | INTRAVENOUS | Status: AC
Start: 2023-09-26 — End: 2023-09-26
  Administered 2023-09-26: 200 mg via INTRAVENOUS
  Filled 2023-09-26: qty 10

## 2023-09-26 NOTE — Patient Instructions (Signed)
Iron Sucrose Injection What is this medication? IRON SUCROSE (EYE ern SOO krose) treats low levels of iron (iron deficiency anemia) in people with kidney disease. Iron is a mineral that plays an important role in making red blood cells, which carry oxygen from your lungs to the rest of your body. This medicine may be used for other purposes; ask your health care provider or pharmacist if you have questions. COMMON BRAND NAME(S): Venofer What should I tell my care team before I take this medication? They need to know if you have any of these conditions: Anemia not caused by low iron levels Heart disease High levels of iron in the blood Kidney disease Liver disease An unusual or allergic reaction to iron, other medications, foods, dyes, or preservatives Pregnant or trying to get pregnant Breastfeeding How should I use this medication? This medication is for infusion into a vein. It is given in a hospital or clinic setting. Talk to your care team about the use of this medication in children. While this medication may be prescribed for children as young as 2 years for selected conditions, precautions do apply. Overdosage: If you think you have taken too much of this medicine contact a poison control center or emergency room at once. NOTE: This medicine is only for you. Do not share this medicine with others. What if I miss a dose? Keep appointments for follow-up doses. It is important not to miss your dose. Call your care team if you are unable to keep an appointment. What may interact with this medication? Do not take this medication with any of the following: Deferoxamine Dimercaprol Other iron products This medication may also interact with the following: Chloramphenicol Deferasirox This list may not describe all possible interactions. Give your health care provider a list of all the medicines, herbs, non-prescription drugs, or dietary supplements you use. Also tell them if you smoke,  drink alcohol, or use illegal drugs. Some items may interact with your medicine. What should I watch for while using this medication? Visit your care team regularly. Tell your care team if your symptoms do not start to get better or if they get worse. You may need blood work done while you are taking this medication. You may need to follow a special diet. Talk to your care team. Foods that contain iron include: whole grains/cereals, dried fruits, beans, or peas, leafy green vegetables, and organ meats (liver, kidney). What side effects may I notice from receiving this medication? Side effects that you should report to your care team as soon as possible: Allergic reactions--skin rash, itching, hives, swelling of the face, lips, tongue, or throat Low blood pressure--dizziness, feeling faint or lightheaded, blurry vision Shortness of breath Side effects that usually do not require medical attention (report to your care team if they continue or are bothersome): Flushing Headache Joint pain Muscle pain Nausea Pain, redness, or irritation at injection site This list may not describe all possible side effects. Call your doctor for medical advice about side effects. You may report side effects to FDA at 1-800-FDA-1088. Where should I keep my medication? This medication is given in a hospital or clinic. It will not be stored at home. NOTE: This sheet is a summary. It may not cover all possible information. If you have questions about this medicine, talk to your doctor, pharmacist, or health care provider.  2024 Elsevier/Gold Standard (2023-04-12 00:00:00)

## 2023-09-30 ENCOUNTER — Inpatient Hospital Stay: Payer: BC Managed Care – PPO

## 2023-09-30 VITALS — BP 122/55 | HR 76 | Temp 98.3°F | Resp 18

## 2023-09-30 DIAGNOSIS — N920 Excessive and frequent menstruation with regular cycle: Secondary | ICD-10-CM | POA: Diagnosis not present

## 2023-09-30 DIAGNOSIS — D509 Iron deficiency anemia, unspecified: Secondary | ICD-10-CM | POA: Diagnosis not present

## 2023-09-30 DIAGNOSIS — L03113 Cellulitis of right upper limb: Secondary | ICD-10-CM | POA: Diagnosis not present

## 2023-09-30 DIAGNOSIS — D500 Iron deficiency anemia secondary to blood loss (chronic): Secondary | ICD-10-CM

## 2023-09-30 MED ORDER — SODIUM CHLORIDE 0.9% FLUSH
10.0000 mL | Freq: Two times a day (BID) | INTRAVENOUS | Status: DC
  Administered 2023-09-30: 10 mL via INTRAVENOUS

## 2023-09-30 MED ORDER — IRON SUCROSE 20 MG/ML IV SOLN
200.0000 mg | Freq: Once | INTRAVENOUS | Status: AC
Start: 2023-09-30 — End: 2023-09-30
  Administered 2023-09-30: 200 mg via INTRAVENOUS
  Filled 2023-09-30: qty 10

## 2023-09-30 NOTE — Patient Instructions (Signed)
Iron Sucrose Injection What is this medication? IRON SUCROSE (EYE ern SOO krose) treats low levels of iron (iron deficiency anemia) in people with kidney disease. Iron is a mineral that plays an important role in making red blood cells, which carry oxygen from your lungs to the rest of your body. This medicine may be used for other purposes; ask your health care provider or pharmacist if you have questions. COMMON BRAND NAME(S): Venofer What should I tell my care team before I take this medication? They need to know if you have any of these conditions: Anemia not caused by low iron levels Heart disease High levels of iron in the blood Kidney disease Liver disease An unusual or allergic reaction to iron, other medications, foods, dyes, or preservatives Pregnant or trying to get pregnant Breastfeeding How should I use this medication? This medication is for infusion into a vein. It is given in a hospital or clinic setting. Talk to your care team about the use of this medication in children. While this medication may be prescribed for children as young as 2 years for selected conditions, precautions do apply. Overdosage: If you think you have taken too much of this medicine contact a poison control center or emergency room at once. NOTE: This medicine is only for you. Do not share this medicine with others. What if I miss a dose? Keep appointments for follow-up doses. It is important not to miss your dose. Call your care team if you are unable to keep an appointment. What may interact with this medication? Do not take this medication with any of the following: Deferoxamine Dimercaprol Other iron products This medication may also interact with the following: Chloramphenicol Deferasirox This list may not describe all possible interactions. Give your health care provider a list of all the medicines, herbs, non-prescription drugs, or dietary supplements you use. Also tell them if you smoke,  drink alcohol, or use illegal drugs. Some items may interact with your medicine. What should I watch for while using this medication? Visit your care team regularly. Tell your care team if your symptoms do not start to get better or if they get worse. You may need blood work done while you are taking this medication. You may need to follow a special diet. Talk to your care team. Foods that contain iron include: whole grains/cereals, dried fruits, beans, or peas, leafy green vegetables, and organ meats (liver, kidney). What side effects may I notice from receiving this medication? Side effects that you should report to your care team as soon as possible: Allergic reactions--skin rash, itching, hives, swelling of the face, lips, tongue, or throat Low blood pressure--dizziness, feeling faint or lightheaded, blurry vision Shortness of breath Side effects that usually do not require medical attention (report to your care team if they continue or are bothersome): Flushing Headache Joint pain Muscle pain Nausea Pain, redness, or irritation at injection site This list may not describe all possible side effects. Call your doctor for medical advice about side effects. You may report side effects to FDA at 1-800-FDA-1088. Where should I keep my medication? This medication is given in a hospital or clinic. It will not be stored at home. NOTE: This sheet is a summary. It may not cover all possible information. If you have questions about this medicine, talk to your doctor, pharmacist, or health care provider.  2024 Elsevier/Gold Standard (2023-04-12 00:00:00)

## 2023-10-02 ENCOUNTER — Inpatient Hospital Stay: Payer: BC Managed Care – PPO

## 2023-10-02 VITALS — BP 129/67 | HR 71 | Temp 97.6°F | Resp 18

## 2023-10-02 DIAGNOSIS — D500 Iron deficiency anemia secondary to blood loss (chronic): Secondary | ICD-10-CM

## 2023-10-02 DIAGNOSIS — D509 Iron deficiency anemia, unspecified: Secondary | ICD-10-CM | POA: Diagnosis not present

## 2023-10-02 DIAGNOSIS — L03113 Cellulitis of right upper limb: Secondary | ICD-10-CM | POA: Diagnosis not present

## 2023-10-02 DIAGNOSIS — N920 Excessive and frequent menstruation with regular cycle: Secondary | ICD-10-CM | POA: Diagnosis not present

## 2023-10-02 MED ORDER — SODIUM CHLORIDE 0.9% FLUSH: 10.0000 mL | Freq: Two times a day (BID) | INTRAVENOUS | Status: DC

## 2023-10-02 MED ORDER — IRON SUCROSE 20 MG/ML IV SOLN
200.0000 mg | Freq: Once | INTRAVENOUS | Status: AC
  Administered 2023-10-02: 200 mg via INTRAVENOUS
  Filled 2023-10-02: qty 10

## 2023-10-03 ENCOUNTER — Other Ambulatory Visit: Payer: Self-pay | Admitting: Pharmacist

## 2023-10-03 ENCOUNTER — Encounter: Payer: Self-pay | Admitting: Hematology and Oncology

## 2023-10-03 ENCOUNTER — Telehealth: Payer: Self-pay

## 2023-10-03 ENCOUNTER — Inpatient Hospital Stay: Payer: BC Managed Care – PPO | Admitting: Hematology and Oncology

## 2023-10-03 VITALS — BP 151/88 | HR 81 | Temp 97.7°F | Resp 20 | Ht 61.52 in | Wt 378.3 lb

## 2023-10-03 DIAGNOSIS — D500 Iron deficiency anemia secondary to blood loss (chronic): Secondary | ICD-10-CM | POA: Diagnosis not present

## 2023-10-03 DIAGNOSIS — L0390 Cellulitis, unspecified: Secondary | ICD-10-CM | POA: Insufficient documentation

## 2023-10-03 DIAGNOSIS — L03113 Cellulitis of right upper limb: Secondary | ICD-10-CM | POA: Diagnosis not present

## 2023-10-03 DIAGNOSIS — D509 Iron deficiency anemia, unspecified: Secondary | ICD-10-CM | POA: Diagnosis not present

## 2023-10-03 DIAGNOSIS — M7989 Other specified soft tissue disorders: Secondary | ICD-10-CM | POA: Diagnosis not present

## 2023-10-03 DIAGNOSIS — N920 Excessive and frequent menstruation with regular cycle: Secondary | ICD-10-CM | POA: Diagnosis not present

## 2023-10-03 MED ORDER — CEPHALEXIN 500 MG PO CAPS: 500.0000 mg | ORAL_CAPSULE | Freq: Three times a day (TID) | ORAL | 0 refills | Status: DC

## 2023-10-03 MED ORDER — LIDOCAINE HCL (PF) 1 % IJ SOLN
5.0000 mL | Freq: Once | INTRAMUSCULAR | Status: AC
  Administered 2023-10-03: 5 mL

## 2023-10-03 MED ORDER — CEFTRIAXONE SODIUM 1 G IJ SOLR
1.0000 g | Freq: Once | INTRAMUSCULAR | Status: AC
  Administered 2023-10-03: 1 g via INTRAMUSCULAR

## 2023-10-03 NOTE — Assessment & Plan Note (Addendum)
Cellulitis at the IV site in the right antecubital area after iron sucrose on 10/02/23. There may have been extravasation of the iron sucrose.  I Will give her IM Rocephin today and start on cephalexin 500 mg 3 times daily.  Due to the significant swelling, I will obtain a stat ultrasound of the right upper extremity to evaluate for DVT.  I will delay her next infusion scheduled for 10/04/2023 until sometime next week.  Scheduled for infusion on 10/07/2023 and we will have her come in that day and reevaluate prior to resuming IV iron.

## 2023-10-03 NOTE — Assessment & Plan Note (Signed)
Longstanding history of iron deficiency anemia with associated reactive thrombocytosis due to menorrhagia.  She has periodically received IV iron, as she does not tolerate oral iron.  Her hemoglobin remains normal, but she has recurrent reactive thrombocytosis. Her iron stores are low, so will schedule her for IV iron in the upcoming days. I asked her to try a multivitamin with iron to see if she can tolerate low dose iron.   Referred her back to gynecology for further recommendations regarding her menorrhagia.  We also referred her to Dr. Jennye Boroughs for t screening colonoscopy. I will plan to see her back in 6 weeks with a CBC and ferritin.  She has been receiving iron sucrose and has cellulitis at her most recent injection site.  Will plan to resume IV iron once this has resolved.

## 2023-10-03 NOTE — Telephone Encounter (Signed)
Attempted to contact patient to let her know she needs to be seen by the PA today. No answer. Will send a mychart message.

## 2023-10-03 NOTE — Progress Notes (Signed)
[facility]  [address] [place],  [place]  [address] [phone]  Clinic Day:  10/03/2023  Referring physician: Abner Greenspan, MD  ASSESSMENT & PLAN:   Assessment & Plan: Cellulitis at injection site Cellulitis at the IV site in the right antecubital area after iron sucrose on 10/02/23. There may have been extravasation of the iron sucrose.  I Will give her IM Rocephin today and start on cephalexin 500 mg 3 times daily.  Due to the significant swelling, I will obtain a stat ultrasound of the right upper extremity to evaluate for DVT.  I will delay her next infusion scheduled for 10/04/2023 until sometime next week.  Scheduled for infusion on 10/07/2023 and we will have her come in that day and reevaluate prior to resuming IV iron.  Iron deficiency anemia Longstanding history of iron deficiency anemia with associated reactive thrombocytosis due to menorrhagia.  She has periodically received IV iron, as she does not tolerate oral iron.  Her hemoglobin remains normal, but she has recurrent reactive thrombocytosis. Her iron stores are low, so will schedule her for IV iron in the upcoming days. I asked her to try a multivitamin with iron to see if she can tolerate low dose iron.   Referred her back to gynecology for further recommendations regarding her menorrhagia.  We also referred her to Dr. Jennye Boroughs for t screening colonoscopy. I will plan to see her back in 6 weeks with a CBC and ferritin.  She has been receiving iron sucrose and has cellulitis at her most recent injection site.  Will plan to resume IV iron once this has resolved.    The patient understands the plans discussed today and is in agreement with them.  She knows to contact our office if she develops concerns prior to her next appointment.   I provided 40 minutes of face-to-face time during this encounter and > 50% was spent counseling as documented under my assessment and plan.    Adah Perl,  PA-C  [facility] [address] [place] [address] Dept: [phone] Dept Fax: [phone]   Orders Placed This Encounter  Procedures   US Venous Img Upper Uni Right(DVT)    Standing Status:   Future    Standing Expiration Date:   10/02/2024    Scheduling Instructions:     RH    Order Specific Question:   Reason for Exam (SYMPTOM  OR DIAGNOSIS REQUIRED)    Answer:   acute onset swelling    Order Specific Question:   Preferred imaging location?    Answer:   External      CHIEF COMPLAINT:  CC: Redness and swelling at the IV site  Current Treatment: IV Venofer  HISTORY OF PRESENT ILLNESS:  Leah Mendoza is a 47 y.o. female with microcytic anemia secondary to iron deficiency with associated reactive thrombocytosis who we began seeing in August 2014.  This has been an ongoing problem for years, felt to be due to menorrhagia.  She does not tolerate oral iron supplementation, so receives IV iron on an as needed basis.  She has been referred to gynecology in the past, but apparently there are financial issues.  She had an EGD in April 2013 with Dr. Jennye Boroughs for upper abdominal pain, which did not reveal any abnormality.  She had colonoscopy with Dr. Charm Barges in June 2015, which was also normal.  Routine  screening colonoscopy at age 35 was recommended, but now that has changed to age 44, so she likely will have repeat screening colonoscopy at age 14, which would be 10 years from the last.  She has received numerous IV iron infusions over the years when she becomes more anemic and symptomatic.  CT abdomen and pelvis in June 2015 revealed endometrial thickening, for which we referred her to [facility] in June 2015, but she never went.  Repeat CT abdomen and pelvis and February 2016 in the emergency department revealed a left adnexal soft tissue density most consistent with an ovarian cyst.  Pelvic ultrasound was recommended and revealed bilateral ovarian cysts. The endometrium looked normal on the ultrasound.  She was seen by GYN in 2019, but has not followed up.   She has required IV iron regularly over the years.  Bilateral screening mammogram in January 2023 did not reveal any evidence of malignancy.  She was found to have folate deficiency, which resolved with folic acid daily.  Her most recent visit on October 29, her ferritin had dropped to 9, so she has been receiving IV iron in the form of Venofer again.  We referred her back to GYN for persistent menorrhagia, as well as Dr. Jennye Boroughs for screening colonoscopy  INTERVAL HISTORY:  Leah Mendoza is here added to the schedule today, as she telephoned reporting swelling and redness at her IV site from yesterday. She denies fevers or chills. She denies pain. Her appetite is good. Her weight has increased 6 pounds over last 2 weeks.   Bilateral screening mammogram on November 4 did not reveal any evidence of malignancy.  REVIEW OF SYSTEMS:  Review of Systems  Constitutional:  Negative for appetite change, chills, fatigue, fever and unexpected weight change.  HENT:   Negative for lump/mass, mouth sores and sore throat.   Respiratory:  Negative for cough and shortness of breath.   Cardiovascular:  Negative for chest pain and leg swelling.  Gastrointestinal:  Negative for abdominal pain, constipation, diarrhea, nausea and vomiting.  Endocrine: Negative for hot flashes.  Genitourinary:  Negative for difficulty urinating, dysuria, frequency and hematuria.   Musculoskeletal:  Negative for arthralgias, back pain and myalgias.  Skin:  Negative for itching and rash.  Neurological:  Negative for dizziness and headaches.  Hematological:  Negative for adenopathy. Does not bruise/bleed easily.  Psychiatric/Behavioral:  Negative for depression and sleep disturbance. The patient is not nervous/anxious.      VITALS:  Blood pressure (!) 151/88,  pulse 81, temperature 97.7 F (36.5 C), temperature source Oral, resp. rate 20, height 5' 1.52" (1.563 m), weight (!) 378 lb 4.8 oz (171.6 kg), last menstrual period 08/25/2023, SpO2 97%.  Wt Readings from Last 3 Encounters:  10/03/23 (!) 378 lb 4.8 oz (171.6 kg)  09/17/23 (!) 372 lb 14.4 oz (169.1 kg)  06/10/23 (!) 367 lb 3.2 oz (166.6 kg)    Body mass index is 70.28 kg/m.  Performance status (ECOG): 1 - Symptomatic but completely ambulatory  PHYSICAL EXAM:  Physical Exam Constitutional:      General: She is not in acute distress.    Appearance: Normal appearance. She is not ill-appearing, toxic-appearing or diaphoretic.  Cardiovascular:     Rate and Rhythm: Normal rate and regular rhythm.     Heart sounds: Normal heart sounds.  Pulmonary:     Breath sounds: Normal breath sounds.  Musculoskeletal:     Right elbow: Swelling present. Tenderness present.     Comments: Swelling and tenderness of  the right antecubital area with overlying warmth and erythema  Lymphadenopathy:     Cervical: No cervical adenopathy.     Upper Body:     Right upper body: No supraclavicular or axillary adenopathy.     Left upper body: No supraclavicular or axillary adenopathy.  Skin:    Findings: Erythema present.     Comments: Erythema, warmth and tenderness of the right antecubital area with swelling  Neurological:     Mental Status: She is alert.     LABS:      Latest Ref Rng & Units 09/17/2023    2:27 PM 06/10/2023   12:00 AM 12/07/2022   12:00 AM  CBC  WBC 4.0 - 10.5 K/uL 13.4  8.7     10.3      Hemoglobin 12.0 - 15.0 g/dL 62.3  76.2     83.1      Hematocrit 36.0 - 46.0 % 45.3  44     44      Platelets 150 - 400 K/uL 449  346     334         This result is from an external source.      Latest Ref Rng & Units 09/17/2023    2:27 PM 12/07/2022    2:08 PM 06/06/2022   12:00 AM  CMP  Glucose 70 - 99 mg/dL 517  616    BUN 6 - 20 mg/dL 15  17  9       Creatinine 0.44 - 1.00 mg/dL 0.73   7.10  0.7      Sodium 135 - 145 mmol/L 136  135  137      Potassium 3.5 - 5.1 mmol/L 4.2  4.1  3.8      Chloride 98 - 111 mmol/L 97  97  101      CO2 22 - 32 mmol/L 29  27  29       Calcium 8.9 - 10.3 mg/dL 9.3  9.4  8.7      Total Protein 6.5 - 8.1 g/dL 8.2  7.6    Total Bilirubin 0.3 - 1.2 mg/dL 0.6  0.3    Alkaline Phos 38 - 126 U/L 85  82  74      AST 15 - 41 U/L 13  18  28       ALT 0 - 44 U/L 20  19  23          This result is from an external source.     Lab Results  Component Value Date   TIBC 455 (H) 09/17/2023   TIBC 360 06/10/2023   TIBC 351 12/07/2022   FERRITIN 9 (L) 09/17/2023   FERRITIN 34 06/10/2023   FERRITIN 89 12/07/2022   IRONPCTSAT 19 09/17/2023   IRONPCTSAT 23 06/10/2023   IRONPCTSAT 26 12/07/2022     STUDIES:    Exam(s): [id number] MAM/MAM DIGITAL TOMO SCREENING B  CLINICAL DATA: Screening.  EXAM:  DIGITAL SCREENING BILATERAL MAMMOGRAM WITH TOMOSYNTHESIS AND CAD  TECHNIQUE:  Bilateral screening digital craniocaudal and mediolateral oblique  mammograms were obtained. Bilateral screening digital breast  tomosynthesis was performed. The images were evaluated with  computer-aided detection.  COMPARISON: 12/08/2021, 06/08/2020  ACR Breast Density Category a: The breasts are almost entirely  fatty.  FINDINGS:  There are no findings suspicious for malignancy.  IMPRESSION:  No mammographic evidence of malignancy. A result letter of this  screening mammogram will be mailed directly to the patient.  RECOMMENDATION:  Screening mammogram in  one year. (Code:SM-B-01Y)  BI-RADS CATEGORY 1: Negative.    HISTORY:   Past Medical History:  Diagnosis Date   Asthma    Folate deficiency 11/24/2021   Hypertension    Iron deficiency anemia 08/25/2020   Iron deficiency anemia secondary to blood loss (chronic)     Past Surgical History:  Procedure Laterality Date   CHOLECYSTECTOMY     KNEE ARTHROSCOPY W/ MENISCAL REPAIR Right 05/10/2023    Family History   Adopted: Yes    Social History:  reports that she has never smoked. She has never used smokeless tobacco. She reports that she does not use drugs. No history on file for alcohol use.The patient is alone  today.  Allergies:  Allergies  Allergen Reactions   Latex     UNKNOWN    Current Medications: Current Outpatient Medications  Medication Sig Dispense Refill   cephALEXin (KEFLEX) 500 MG capsule Take 1 capsule (500 mg total) by mouth 3 (three) times daily. 30 capsule 0   aspirin EC (BAYER LOW DOSE) 81 MG tablet Take 81 mg by mouth daily.     LANTUS SOLOSTAR 100 UNIT/ML Solostar Pen Inject 10 Units into the skin at bedtime. (Patient not taking: Reported on 09/17/2023)     No current facility-administered medications for this visit.

## 2023-10-04 ENCOUNTER — Telehealth: Payer: Self-pay

## 2023-10-04 ENCOUNTER — Inpatient Hospital Stay: Payer: BC Managed Care – PPO

## 2023-10-04 NOTE — Telephone Encounter (Signed)
-----   Message from Leah Mendoza sent at 10/04/2023  8:38 AM EST ----- Please let her know she does not have a blood clot in the arm. Thanks

## 2023-10-04 NOTE — Telephone Encounter (Signed)
Detailed message left for patient.

## 2023-10-07 ENCOUNTER — Inpatient Hospital Stay: Payer: BC Managed Care – PPO

## 2023-10-07 VITALS — BP 139/56 | HR 78 | Temp 98.0°F | Resp 18 | Wt 379.0 lb

## 2023-10-07 DIAGNOSIS — D500 Iron deficiency anemia secondary to blood loss (chronic): Secondary | ICD-10-CM

## 2023-10-07 DIAGNOSIS — L03113 Cellulitis of right upper limb: Secondary | ICD-10-CM | POA: Diagnosis not present

## 2023-10-07 DIAGNOSIS — D509 Iron deficiency anemia, unspecified: Secondary | ICD-10-CM | POA: Diagnosis not present

## 2023-10-07 DIAGNOSIS — N920 Excessive and frequent menstruation with regular cycle: Secondary | ICD-10-CM | POA: Diagnosis not present

## 2023-10-07 MED ORDER — IRON SUCROSE 20 MG/ML IV SOLN
200.0000 mg | Freq: Once | INTRAVENOUS | Status: AC
  Administered 2023-10-07: 200 mg via INTRAVENOUS
  Filled 2023-10-07: qty 10

## 2023-10-07 MED ORDER — SODIUM CHLORIDE 0.9% FLUSH
10.0000 mL | Freq: Two times a day (BID) | INTRAVENOUS | Status: DC
  Administered 2023-10-07: 10 mL via INTRAVENOUS

## 2023-10-07 NOTE — Patient Instructions (Signed)
Iron Sucrose Injection What is this medication? IRON SUCROSE (EYE ern SOO krose) treats low levels of iron (iron deficiency anemia) in people with kidney disease. Iron is a mineral that plays an important role in making red blood cells, which carry oxygen from your lungs to the rest of your body. This medicine may be used for other purposes; ask your health care provider or pharmacist if you have questions. COMMON BRAND NAME(S): Venofer What should I tell my care team before I take this medication? They need to know if you have any of these conditions: Anemia not caused by low iron levels Heart disease High levels of iron in the blood Kidney disease Liver disease An unusual or allergic reaction to iron, other medications, foods, dyes, or preservatives Pregnant or trying to get pregnant Breastfeeding How should I use this medication? This medication is for infusion into a vein. It is given in a hospital or clinic setting. Talk to your care team about the use of this medication in children. While this medication may be prescribed for children as young as 2 years for selected conditions, precautions do apply. Overdosage: If you think you have taken too much of this medicine contact a poison control center or emergency room at once. NOTE: This medicine is only for you. Do not share this medicine with others. What if I miss a dose? Keep appointments for follow-up doses. It is important not to miss your dose. Call your care team if you are unable to keep an appointment. What may interact with this medication? Do not take this medication with any of the following: Deferoxamine Dimercaprol Other iron products This medication may also interact with the following: Chloramphenicol Deferasirox This list may not describe all possible interactions. Give your health care provider a list of all the medicines, herbs, non-prescription drugs, or dietary supplements you use. Also tell them if you smoke,  drink alcohol, or use illegal drugs. Some items may interact with your medicine. What should I watch for while using this medication? Visit your care team regularly. Tell your care team if your symptoms do not start to get better or if they get worse. You may need blood work done while you are taking this medication. You may need to follow a special diet. Talk to your care team. Foods that contain iron include: whole grains/cereals, dried fruits, beans, or peas, leafy green vegetables, and organ meats (liver, kidney). What side effects may I notice from receiving this medication? Side effects that you should report to your care team as soon as possible: Allergic reactions--skin rash, itching, hives, swelling of the face, lips, tongue, or throat Low blood pressure--dizziness, feeling faint or lightheaded, blurry vision Shortness of breath Side effects that usually do not require medical attention (report to your care team if they continue or are bothersome): Flushing Headache Joint pain Muscle pain Nausea Pain, redness, or irritation at injection site This list may not describe all possible side effects. Call your doctor for medical advice about side effects. You may report side effects to FDA at 1-800-FDA-1088. Where should I keep my medication? This medication is given in a hospital or clinic. It will not be stored at home. NOTE: This sheet is a summary. It may not cover all possible information. If you have questions about this medicine, talk to your doctor, pharmacist, or health care provider.  2024 Elsevier/Gold Standard (2023-04-12 00:00:00)

## 2023-10-08 ENCOUNTER — Inpatient Hospital Stay: Payer: BC Managed Care – PPO

## 2023-10-08 ENCOUNTER — Encounter: Payer: Self-pay | Admitting: Hematology and Oncology

## 2023-10-08 VITALS — BP 161/68 | HR 75 | Temp 98.2°F | Resp 18

## 2023-10-08 DIAGNOSIS — L03113 Cellulitis of right upper limb: Secondary | ICD-10-CM | POA: Diagnosis not present

## 2023-10-08 DIAGNOSIS — N920 Excessive and frequent menstruation with regular cycle: Secondary | ICD-10-CM | POA: Diagnosis not present

## 2023-10-08 DIAGNOSIS — D500 Iron deficiency anemia secondary to blood loss (chronic): Secondary | ICD-10-CM

## 2023-10-08 DIAGNOSIS — D509 Iron deficiency anemia, unspecified: Secondary | ICD-10-CM | POA: Diagnosis not present

## 2023-10-08 MED ORDER — SODIUM CHLORIDE 0.9% FLUSH
10.0000 mL | Freq: Two times a day (BID) | INTRAVENOUS | Status: DC
  Administered 2023-10-08: 10 mL via INTRAVENOUS

## 2023-10-08 MED ORDER — IRON SUCROSE 20 MG/ML IV SOLN
200.0000 mg | Freq: Once | INTRAVENOUS | Status: AC
Start: 2023-10-08 — End: 2023-10-08
  Administered 2023-10-08: 200 mg via INTRAVENOUS
  Filled 2023-10-08: qty 10

## 2023-10-08 NOTE — Patient Instructions (Signed)
Iron Sucrose Injection What is this medication? IRON SUCROSE (EYE ern SOO krose) treats low levels of iron (iron deficiency anemia) in people with kidney disease. Iron is a mineral that plays an important role in making red blood cells, which carry oxygen from your lungs to the rest of your body. This medicine may be used for other purposes; ask your health care provider or pharmacist if you have questions. COMMON BRAND NAME(S): Venofer What should I tell my care team before I take this medication? They need to know if you have any of these conditions: Anemia not caused by low iron levels Heart disease High levels of iron in the blood Kidney disease Liver disease An unusual or allergic reaction to iron, other medications, foods, dyes, or preservatives Pregnant or trying to get pregnant Breastfeeding How should I use this medication? This medication is for infusion into a vein. It is given in a hospital or clinic setting. Talk to your care team about the use of this medication in children. While this medication may be prescribed for children as young as 2 years for selected conditions, precautions do apply. Overdosage: If you think you have taken too much of this medicine contact a poison control center or emergency room at once. NOTE: This medicine is only for you. Do not share this medicine with others. What if I miss a dose? Keep appointments for follow-up doses. It is important not to miss your dose. Call your care team if you are unable to keep an appointment. What may interact with this medication? Do not take this medication with any of the following: Deferoxamine Dimercaprol Other iron products This medication may also interact with the following: Chloramphenicol Deferasirox This list may not describe all possible interactions. Give your health care provider a list of all the medicines, herbs, non-prescription drugs, or dietary supplements you use. Also tell them if you smoke,  drink alcohol, or use illegal drugs. Some items may interact with your medicine. What should I watch for while using this medication? Visit your care team regularly. Tell your care team if your symptoms do not start to get better or if they get worse. You may need blood work done while you are taking this medication. You may need to follow a special diet. Talk to your care team. Foods that contain iron include: whole grains/cereals, dried fruits, beans, or peas, leafy green vegetables, and organ meats (liver, kidney). What side effects may I notice from receiving this medication? Side effects that you should report to your care team as soon as possible: Allergic reactions--skin rash, itching, hives, swelling of the face, lips, tongue, or throat Low blood pressure--dizziness, feeling faint or lightheaded, blurry vision Shortness of breath Side effects that usually do not require medical attention (report to your care team if they continue or are bothersome): Flushing Headache Joint pain Muscle pain Nausea Pain, redness, or irritation at injection site This list may not describe all possible side effects. Call your doctor for medical advice about side effects. You may report side effects to FDA at 1-800-FDA-1088. Where should I keep my medication? This medication is given in a hospital or clinic. It will not be stored at home. NOTE: This sheet is a summary. It may not cover all possible information. If you have questions about this medicine, talk to your doctor, pharmacist, or health care provider.  2024 Elsevier/Gold Standard (2023-04-12 00:00:00)

## 2023-10-15 ENCOUNTER — Encounter: Payer: Self-pay | Admitting: Hematology and Oncology

## 2023-10-28 ENCOUNTER — Encounter: Payer: Self-pay | Admitting: Hematology and Oncology

## 2023-10-29 ENCOUNTER — Inpatient Hospital Stay: Payer: BC Managed Care – PPO

## 2023-10-29 ENCOUNTER — Inpatient Hospital Stay: Payer: BC Managed Care – PPO | Admitting: Hematology and Oncology

## 2023-10-30 ENCOUNTER — Telehealth: Payer: Self-pay | Admitting: Hematology and Oncology

## 2023-10-30 ENCOUNTER — Other Ambulatory Visit: Payer: Self-pay | Admitting: Hematology and Oncology

## 2023-10-30 ENCOUNTER — Inpatient Hospital Stay (HOSPITAL_BASED_OUTPATIENT_CLINIC_OR_DEPARTMENT_OTHER): Payer: BC Managed Care – PPO | Admitting: Hematology and Oncology

## 2023-10-30 ENCOUNTER — Encounter: Payer: Self-pay | Admitting: Hematology and Oncology

## 2023-10-30 ENCOUNTER — Inpatient Hospital Stay: Payer: BC Managed Care – PPO | Attending: Hematology and Oncology

## 2023-10-30 VITALS — BP 154/100 | HR 72 | Temp 97.6°F | Resp 20 | Ht 61.52 in | Wt 384.1 lb

## 2023-10-30 DIAGNOSIS — D500 Iron deficiency anemia secondary to blood loss (chronic): Secondary | ICD-10-CM

## 2023-10-30 DIAGNOSIS — D72829 Elevated white blood cell count, unspecified: Secondary | ICD-10-CM | POA: Diagnosis not present

## 2023-10-30 DIAGNOSIS — N920 Excessive and frequent menstruation with regular cycle: Secondary | ICD-10-CM | POA: Insufficient documentation

## 2023-10-30 DIAGNOSIS — L0390 Cellulitis, unspecified: Secondary | ICD-10-CM | POA: Diagnosis not present

## 2023-10-30 DIAGNOSIS — D509 Iron deficiency anemia, unspecified: Secondary | ICD-10-CM | POA: Diagnosis not present

## 2023-10-30 LAB — FERRITIN: Ferritin: 65 ng/mL (ref 11–307)

## 2023-10-30 LAB — CBC WITH DIFFERENTIAL (CANCER CENTER ONLY)
Abs Immature Granulocytes: 0.03 10*3/uL (ref 0.00–0.07)
Basophils Absolute: 0.1 10*3/uL (ref 0.0–0.1)
Basophils Relative: 1 %
Eosinophils Absolute: 0.2 10*3/uL (ref 0.0–0.5)
Eosinophils Relative: 1 %
HCT: 41.7 % (ref 36.0–46.0)
Hemoglobin: 14.1 g/dL (ref 12.0–15.0)
Immature Granulocytes: 0 %
Lymphocytes Relative: 21 %
Lymphs Abs: 2.2 10*3/uL (ref 0.7–4.0)
MCH: 30.2 pg (ref 26.0–34.0)
MCHC: 33.8 g/dL (ref 30.0–36.0)
MCV: 89.3 fL (ref 80.0–100.0)
Monocytes Absolute: 0.7 10*3/uL (ref 0.1–1.0)
Monocytes Relative: 6 %
Neutro Abs: 7.3 10*3/uL (ref 1.7–7.7)
Neutrophils Relative %: 71 %
Platelet Count: 326 10*3/uL (ref 150–400)
RBC: 4.67 MIL/uL (ref 3.87–5.11)
RDW: 12.8 % (ref 11.5–15.5)
WBC Count: 10.4 10*3/uL (ref 4.0–10.5)
nRBC: 0 % (ref 0.0–0.2)
nRBC: 0 /100{WBCs}

## 2023-10-30 LAB — CMP (CANCER CENTER ONLY)
ALT: 16 U/L (ref 0–44)
AST: 11 U/L — ABNORMAL LOW (ref 15–41)
Albumin: 4 g/dL (ref 3.5–5.0)
Alkaline Phosphatase: 105 U/L (ref 38–126)
Anion gap: 12 (ref 5–15)
BUN: 15 mg/dL (ref 6–20)
CO2: 27 mmol/L (ref 22–32)
Calcium: 9.1 mg/dL (ref 8.9–10.3)
Chloride: 98 mmol/L (ref 98–111)
Creatinine: 0.83 mg/dL (ref 0.44–1.00)
GFR, Estimated: >60 mL/min (ref >60)
Glucose, Bld: 307 mg/dL — ABNORMAL HIGH (ref 70–99)
Potassium: 4.1 mmol/L (ref 3.5–5.1)
Sodium: 137 mmol/L (ref 135–145)
Total Bilirubin: 0.3 mg/dL (ref <1.2)
Total Protein: 7.3 g/dL (ref 6.5–8.1)

## 2023-10-30 LAB — IRON AND TIBC
Iron: 78 ug/dL (ref 28–170)
Saturation Ratios: 20 % (ref 10.4–31.8)
TIBC: 385 ug/dL (ref 250–450)
UIBC: 307 ug/dL

## 2023-10-30 NOTE — Progress Notes (Cosign Needed Addendum)
[facility]  [address] [place],  [place]  [address] [phone]  Clinic Day:  10/30/2023  Referring physician: Abner Greenspan, MD  ASSESSMENT & PLAN:   Assessment & Plan:  Iron deficiency anemia Longstanding history of iron deficiency anemia with associated reactive thrombocytosis felt to be due to menorrhagia.  She has periodically received IV iron, as she does not tolerate oral iron.  She was found recurrent iron deficiency in October when she had recurrent thrombocytosis, so received IV iron again in November. I referred her back to gynecology for further recommendations regarding her menorrhagia and she is scheduled to be seen.  We also referred her to Dr. Jennye Boroughs for screening colonoscopy and this is scheduled for January 2025  She developed cellulitis at the IV site in the right antecubital area after iron sucrose on 10/02/23.  She was given IM Rocephin and placed on cephalexin 500 mg 3 times daily. She had associated leukocytosis. Due to the significant swelling, she had a state ultrasound of the right upper extremity that was negative for DVT.  The cellulitis resolved with antibiotic and she completed 5 doses of iron sucrose.  She has had a good response to the IV iron. She states she is tolerating a multivitamin with iron without difficulty. I will plan to see her back in 3 months with a CBC, CMP, iron/TIBC, and ferritin.    The patient understands the plans discussed today and is in agreement with them.  She knows to contact our office if she develops concerns prior to her next appointment.   I provided 15 minutes of face-to-face time during this encounter and > 50% was spent counseling as documented under my assessment and plan.    Adah Perl, PA-C  [facility]  - [facility] [address] [place] [address] Dept: [phone] Dept Fax: [phone]   Orders Placed This  Encounter  Procedures   CBC with Differential (Cancer Center Only)    Standing Status:   Future    Standing Expiration Date:   10/29/2024   CMP (Cancer Center only)    Standing Status:   Future    Standing Expiration Date:   10/29/2024   Ferritin    Standing Status:   Future    Standing Expiration Date:   10/29/2024   Iron and TIBC    Standing Status:   Future    Standing Expiration Date:   10/29/2024     CHIEF COMPLAINT:  CC: Iron deficiency anemia  Current Treatment:  IV iron as needed  HISTORY OF PRESENT ILLNESS:  Leah Mendoza is a 47 y.o. female with microcytic anemia secondary to iron deficiency with associated reactive thrombocytosis who we began seeing in August 2014.  This has been an ongoing problem for years, felt to be due to menorrhagia.  She does not tolerate oral iron supplementation, so receives IV iron on an as needed basis.  She has been referred to gynecology in the past, but apparently there are financial issues.  She had an EGD in April 2013 with Dr. Jennye Boroughs for upper abdominal pain, which did not reveal any abnormality.  She had colonoscopy with Dr. Charm Barges in June 2015, which was also normal.  Routine screening colonoscopy at age 89 was recommended, but now that has changed to age 59, so she likely will have repeat screening colonoscopy at age 64, which would be 10 years from the  last.  She has received numerous IV iron infusions over the years when she becomes more anemic and symptomatic.  CT abdomen and pelvis in June 2015 revealed endometrial thickening, for which we referred her to [facility] in June 2015, but she never went.  Repeat CT abdomen and pelvis and February 2016 in the emergency department revealed a left adnexal soft tissue density most consistent with an ovarian cyst. Pelvic ultrasound was recommended and revealed bilateral ovarian cysts. The endometrium looked normal on the ultrasound.  She was seen by GYN in 2019, but has not  followed up.   She has required IV iron regularly over the years.  Bilateral screening mammogram in January 2023 did not reveal any evidence of malignancy.  She was found to have folate deficiency, which resolved with folic acid daily.  Her ferritin dropped to 9 in October 29, so she was given receiving IV iron in the form of Venofer again. We referred her back to GYN for persistent menorrhagia, as well as Dr. Jennye Boroughs for screening colonoscopy.  She developed cellulitis at the IV site in the right antecubital area after iron sucrose on November 13.  She was given IM Rocephin and placed on cephalexin 500 mg 3 times daily.  Due to the significant swelling, she had a stat ultrasound of the right upper extremity that was negative for DVT. The cellulitis resolved with antibiotic and she completed 5 doses of iron sucrose.  Bilateral screening mammogram in November 2024 was negative.   INTERVAL HISTORY:  Leah Mendoza is here today for repeat clinical assessment. She denies progressive fatigue concerning for recurrent iron deficiency. She denies pica to ice. She continues to have irregular, heavy menses with her last cycle lasting 3 weeks and heavy most days with left lower abdominal pain.  She denies melana or hematochezia. She is scheduled to see the gynecologist soon. She is scheduled with Dr. Jennye Boroughs in January. She has not seen her PCP again regarding her elevated blood sugars.has increased 5 pounds over last 4 weeks  REVIEW OF SYSTEMS:  Review of Systems  Constitutional:  Negative for appetite change, chills, fatigue, fever and unexpected weight change.  HENT:   Negative for lump/mass, mouth sores and sore throat.   Respiratory:  Negative for cough and shortness of breath.   Cardiovascular:  Negative for chest pain and leg swelling.  Gastrointestinal:  Negative for abdominal pain, constipation, diarrhea, nausea and vomiting.  Endocrine: Negative for hot flashes.  Genitourinary:  Positive for  menstrual problem. Negative for difficulty urinating, dysuria, frequency and hematuria.   Musculoskeletal:  Negative for arthralgias, back pain and myalgias.  Skin:  Negative for rash.  Neurological:  Negative for dizziness and headaches.  Hematological:  Negative for adenopathy. Does not bruise/bleed easily.  Psychiatric/Behavioral:  Negative for depression and sleep disturbance. The patient is not nervous/anxious.     VITALS:  Blood pressure (!) 154/100, pulse 72, temperature 97.6 F (36.4 C), temperature source Oral, resp. rate 20, height 5' 1.52" (1.563 m), weight (!) 384 lb 1.6 oz (174.2 kg), last menstrual period 09/25/2023, SpO2 97%.  Wt Readings from Last 3 Encounters:  10/30/23 (!) 384 lb 1.6 oz (174.2 kg)  10/07/23 (!) 379 lb (171.9 kg)  10/03/23 (!) 378 lb 4.8 oz (171.6 kg)    Body mass index is 71.35 kg/m.  Performance status (ECOG): 1 - Symptomatic but completely ambulatory  PHYSICAL EXAM:  Physical Exam Vitals and nursing note reviewed.  Constitutional:      General: She is  not in acute distress.    Appearance: She is obese. She is not ill-appearing or toxic-appearing.  HENT:     Head: Normocephalic and atraumatic.     Mouth/Throat:     Mouth: Mucous membranes are moist.     Pharynx: Oropharynx is clear. No oropharyngeal exudate or posterior oropharyngeal erythema.  Eyes:     General: No scleral icterus.    Extraocular Movements: Extraocular movements intact.     Conjunctiva/sclera: Conjunctivae normal.     Pupils: Pupils are equal, round, and reactive to light.  Cardiovascular:     Rate and Rhythm: Normal rate and regular rhythm.     Heart sounds: Normal heart sounds. No murmur heard.    No friction rub. No gallop.  Pulmonary:     Effort: Pulmonary effort is normal.     Breath sounds: Normal breath sounds. No wheezing, rhonchi or rales.  Abdominal:     General: There is no distension.     Palpations: Abdomen is soft. There is no mass.     Tenderness: There  is no abdominal tenderness.     Comments: Difficult to assess for hepatosplenomegaly due to body habitus  Musculoskeletal:        General: Normal range of motion.     Cervical back: Normal range of motion and neck supple. No tenderness.     Right lower leg: No edema.     Left lower leg: No edema.  Lymphadenopathy:     Cervical: No cervical adenopathy.     Upper Body:     Right upper body: No supraclavicular or axillary adenopathy.     Left upper body: No supraclavicular or axillary adenopathy.     Lower Body: No right inguinal adenopathy. No left inguinal adenopathy.  Skin:    General: Skin is warm and dry.     Coloration: Skin is not jaundiced.     Findings: No rash.  Neurological:     General: No focal deficit present.     Mental Status: She is alert and oriented to person, place, and time. Mental status is at baseline.     Cranial Nerves: No cranial nerve deficit.  Psychiatric:        Mood and Affect: Mood normal.        Behavior: Behavior normal.        Thought Content: Thought content normal.        Judgment: Judgment normal.    LABS:      Latest Ref Rng & Units 10/30/2023    2:37 PM 09/17/2023    2:27 PM 06/10/2023   12:00 AM  CBC  WBC 4.0 - 10.5 K/uL 10.4  13.4  8.7      Hemoglobin 12.0 - 15.0 g/dL 13.2  44.0  10.2      Hematocrit 36.0 - 46.0 % 41.7  45.3  44      Platelets 150 - 400 K/uL 326  449  346         This result is from an external source.      Latest Ref Rng & Units 10/30/2023    2:37 PM 09/17/2023    2:27 PM 12/07/2022    2:08 PM  CMP  Glucose 70 - 99 mg/dL 725  366  440   BUN 6 - 20 mg/dL 15  15  17    Creatinine 0.44 - 1.00 mg/dL 3.47  4.25  9.56   Sodium 135 - 145 mmol/L 137  136  135  Potassium 3.5 - 5.1 mmol/L 4.1  4.2  4.1   Chloride 98 - 111 mmol/L 98  97  97   CO2 22 - 32 mmol/L 27  29  27    Calcium 8.9 - 10.3 mg/dL 9.1  9.3  9.4   Total Protein 6.5 - 8.1 g/dL 7.3  8.2  7.6   Total Bilirubin <1.2 mg/dL 0.3  0.6  0.3   Alkaline Phos  38 - 126 U/L 105  85  82   AST 15 - 41 U/L 11  13  18    ALT 0 - 44 U/L 16  20  19       Lab Results  Component Value Date   TIBC 385 10/30/2023   TIBC 455 (H) 09/17/2023   TIBC 360 06/10/2023   FERRITIN 65 10/30/2023   FERRITIN 9 (L) 09/17/2023   FERRITIN 34 06/10/2023   IRONPCTSAT 20 10/30/2023   IRONPCTSAT 19 09/17/2023   IRONPCTSAT 23 06/10/2023     STUDIES:    Exam(s): [id number] US/US EXTREM UP VENOUS-R  EXAM:  US EXTREMITY UP VENOUS-R  CLINICAL HISTORY:  [age] old female with swelling of the right upper extremity.  COMPARISON:  None  TECHNIQUE:  Grayscale, color flow and doppler analysis of the right upper extremity deep venous system.  FINDINGS:  There is no intraluminal echogenicity to suggest acute or chronic deep venous thrombosis.  Appropriate respiratory variation, augmentation and venous compression is noted.  IMPRESSION:  No sonographic evidence of a deep venous thrombosis.      Exam(s): [id number] MAM/MAM DIGITAL TOMO SCREENING B  CLINICAL DATA: Screening.  EXAM:  DIGITAL SCREENING BILATERAL MAMMOGRAM WITH TOMOSYNTHESIS AND CAD  TECHNIQUE:  Bilateral screening digital craniocaudal and mediolateral oblique  mammograms were obtained. Bilateral screening digital breast  tomosynthesis was performed. The images were evaluated with  computer-aided detection.  COMPARISON: 12/08/2021, 06/08/2020  ACR Breast Density Category a: The breasts are almost entirely  fatty.  FINDINGS:  There are no findings suspicious for malignancy.  IMPRESSION:  No mammographic evidence of malignancy. A result letter of this  screening mammogram will be mailed directly to the patient.  RECOMMENDATION:  Screening mammogram in one year. (Code:SM-B-01Y)  BI-RADS CATEGORY 1: Negative.   HISTORY:   Past Medical History:  Diagnosis Date   Asthma    Folate deficiency 11/24/2021   Hypertension    Iron deficiency anemia 08/25/2020   Iron deficiency anemia secondary to  blood loss (chronic)     Past Surgical History:  Procedure Laterality Date   CHOLECYSTECTOMY     KNEE ARTHROSCOPY W/ MENISCAL REPAIR Right 05/10/2023    Family History  Adopted: Yes    Social History:  reports that she has never smoked. She has never used smokeless tobacco. She reports that she does not use drugs. No history on file for alcohol use.The patient is alone today.  Allergies:  Allergies  Allergen Reactions   Latex     UNKNOWN    Current Medications: Current Outpatient Medications  Medication Sig Dispense Refill   LANTUS SOLOSTAR 100 UNIT/ML Solostar Pen Inject 10 Units into the skin at bedtime.     aspirin EC (BAYER LOW DOSE) 81 MG tablet Take 81 mg by mouth daily.     No current facility-administered medications for this visit.     I,Jasmine M Lassiter,acting as a Neurosurgeon for [employer], PA-C.,have documented all relevant documentation on the behalf of Adah Perl, PA-C,as directed by  Adah Perl, PA-C  while in the presence of Adah Perl, PA-C.

## 2023-10-30 NOTE — Assessment & Plan Note (Signed)
 Mild leukocytosis/neutrophilia, likely reactive. Will continue to monitor.

## 2023-10-30 NOTE — Assessment & Plan Note (Signed)
 Longstanding history of iron deficiency anemia with associated reactive thrombocytosis due to menorrhagia.  She has periodically received IV iron, as she does not tolerate oral iron.  Her hemoglobin remains normal, but she has recurrent reactive thrombocytosis. Her iron stores are low, so will schedule her for IV iron in the upcoming days. I asked her to try a multivitamin with iron to see if she can tolerate low dose iron.   Referred her back to gynecology for further recommendations regarding her menorrhagia.  We also referred her to Dr. Jennye Boroughs for t screening colonoscopy. I will plan to see her back in 6 weeks with a CBC and ferritin.  She has been receiving iron sucrose and has cellulitis at her most recent injection site.  Will plan to resume IV iron once this has resolved.

## 2023-10-30 NOTE — Assessment & Plan Note (Signed)
 Cellulitis at the IV site in the right antecubital area after iron sucrose on 10/02/23. There may have been extravasation of the iron sucrose.  I Will give her IM Rocephin today and start on cephalexin 500 mg 3 times daily.  Due to the significant swelling, I will obtain a stat ultrasound of the right upper extremity to evaluate for DVT.  I will delay her next infusion scheduled for 10/04/2023 until sometime next week.  Scheduled for infusion on 10/07/2023 and we will have her come in that day and reevaluate prior to resuming IV iron.

## 2023-10-30 NOTE — Telephone Encounter (Signed)
Patient has been scheduled for follow-up visit per 10/30/23 LOS.  Pt given an appt calendar with date and time.

## 2023-10-31 ENCOUNTER — Telehealth: Payer: Self-pay

## 2023-10-31 NOTE — Telephone Encounter (Signed)
Message left normal labs, call with any questions.

## 2023-10-31 NOTE — Telephone Encounter (Signed)
-----   Message from Adah Perl sent at 10/30/2023  7:35 PM EST ----- Please let her know her iron tests look good. Thanks

## 2023-12-03 DIAGNOSIS — K5900 Constipation, unspecified: Secondary | ICD-10-CM | POA: Diagnosis not present

## 2023-12-03 DIAGNOSIS — D509 Iron deficiency anemia, unspecified: Secondary | ICD-10-CM | POA: Diagnosis not present

## 2023-12-16 ENCOUNTER — Ambulatory Visit (INDEPENDENT_AMBULATORY_CARE_PROVIDER_SITE_OTHER): Payer: BC Managed Care – PPO | Admitting: Student

## 2023-12-16 ENCOUNTER — Encounter (HOSPITAL_BASED_OUTPATIENT_CLINIC_OR_DEPARTMENT_OTHER): Payer: Self-pay | Admitting: Student

## 2023-12-16 VITALS — BP 169/66 | HR 104 | Temp 98.4°F | Ht 60.24 in | Wt 358.5 lb

## 2023-12-16 DIAGNOSIS — I10 Essential (primary) hypertension: Secondary | ICD-10-CM

## 2023-12-16 DIAGNOSIS — E66813 Obesity, class 3: Secondary | ICD-10-CM | POA: Diagnosis not present

## 2023-12-16 DIAGNOSIS — Z794 Long term (current) use of insulin: Secondary | ICD-10-CM

## 2023-12-16 DIAGNOSIS — Z7689 Persons encountering health services in other specified circumstances: Secondary | ICD-10-CM

## 2023-12-16 DIAGNOSIS — E119 Type 2 diabetes mellitus without complications: Secondary | ICD-10-CM | POA: Diagnosis not present

## 2023-12-16 DIAGNOSIS — J4520 Mild intermittent asthma, uncomplicated: Secondary | ICD-10-CM | POA: Diagnosis not present

## 2023-12-16 DIAGNOSIS — Z6841 Body Mass Index (BMI) 40.0 and over, adult: Secondary | ICD-10-CM

## 2023-12-16 MED ORDER — LANTUS SOLOSTAR 100 UNIT/ML ~~LOC~~ SOPN: 12.0000 [IU] | PEN_INJECTOR | Freq: Every day | SUBCUTANEOUS | 5 refills | Status: DC

## 2023-12-16 MED ORDER — FREESTYLE LIBRE 3 SENSOR MISC: 1.0000 | 5 refills | Status: AC

## 2023-12-16 MED ORDER — FREESTYLE LIBRE 3 READER DEVI: 1.0000 | Freq: Every day | 0 refills | Status: AC

## 2023-12-16 MED ORDER — OLMESARTAN MEDOXOMIL 5 MG PO TABS: 5.0000 mg | ORAL_TABLET | Freq: Every day | ORAL | 4 refills | Status: DC

## 2023-12-16 MED ORDER — ALBUTEROL SULFATE (2.5 MG/3ML) 0.083% IN NEBU: 2.5000 mg | INHALATION_SOLUTION | Freq: Four times a day (QID) | RESPIRATORY_TRACT | 1 refills | Status: AC | PRN

## 2023-12-16 MED ORDER — TIRZEPATIDE 2.5 MG/0.5ML ~~LOC~~ SOAJ: 2.5000 mg | SUBCUTANEOUS | 4 refills | Status: DC

## 2023-12-16 NOTE — Assessment & Plan Note (Signed)
Extremely poorly controlled, increasing lantus to 12 units nightly. Discussed titrating up insulin by 2 units daily based upon morning fasting glucose.   Order A1c to assess overall BG control  Order freestyle libre to allow for better control of BG  Order [facility], patient is an excellent candidate with high BG and Morbid obesity.  Patient is having symptoms of hyperglycemia. History of DKA.

## 2023-12-16 NOTE — Assessment & Plan Note (Signed)
Order olmesartan and will schedule lab visit 2 weeks out to assess electrolytes and Cr.  Patient has been advised to keep track of BP at home.

## 2023-12-16 NOTE — Patient Instructions (Addendum)
It was nice to see you today!  Please message me in a couple weeks to know how your BP and blood glucose are doing. We will try to get mounjaro covered by your insurance- if they try to charge you a lot, let us know.  If you have any problems before your next visit feel free to message me via MyChart (minor issues or questions) or call the office, otherwise you may reach out to schedule an office visit.  Thank you! Gerilyn Pilgrim Geno Sydnor, PA-C

## 2023-12-16 NOTE — Progress Notes (Signed)
New Patient Office Visit  Subjective    Patient ID: Leah Mendoza, female    DOB: May 27, 1976  Age: 48 y.o. MRN: [id number]  CC:  Chief Complaint  Patient presents with   Establish Care    Here to establish care. Would like to discuss diabetes type 2. Almost out of lantus.    Weight Loss    Would looking into getting medicine for weight loss.     HPI [name] presents to establish care. Prior PCP was at Dr. Yetta Flock. Last physical was last year. she notes that she requires refills of lantus . mL at bedtime  T2DM- has tried metformin (she believes that she is allergic), nph insulin, and lantus insulin. History of DKA. Has now been checking at home- 365 this morning. No history of pancreatitis. No known history of men1 or men2. Patient notes that she is thirsty often and peeing constantly.   Obesity- Has tried diet to cut back on sweets and increased protein, patient has done this for 6 months or more. Patient tries to walk from her house to her Aunt's house and if indoors she follows a video for exercise. Patient would like pharmaceutical help at this point.  IDA- follows with Belva Crome, PA at [facility]. IDA has been ruled due to heavy menstrual bleeding. On po iron for now but she has had intermittent IV iron.  Asthma- has not had issues with this and is not on therapy. She's states that she does not currently have an inhaler at home.  Hypertension- Not on medication currently. She states that her BP has been high on > 2 occasions and is agreeable to starting medications. Discussed olmesartan as medication.  Screenings:  Colon Cancer: Colonoscopy in June- had one previously but it has been a long time. Breast Cancer: Mammogram- December - Clean Diabetes: indicated HLD: indicated   Outpatient Encounter Medications as of 12/16/2023  Medication Sig   albuterol (PROVENTIL) (2.5 MG/3ML) 0.083% nebulizer solution Take 3 mLs (2.5  mg total) by nebulization every 6 (six) hours as needed for wheezing or shortness of breath.   Continuous Glucose Receiver (FREESTYLE LIBRE 3 READER) DEVI 1 Device by Does not apply route daily.   Continuous Glucose Sensor (FREESTYLE LIBRE 3 SENSOR) MISC Place 1 each onto the skin every 14 (fourteen) days. Place 1 sensor on the skin every 14 days. Use to check glucose continuously   Iron-Vitamin C (VITRON-C PO) Take by mouth daily.   olmesartan (BENICAR) 5 MG tablet Take 1 tablet (5 mg total) by mouth daily.   tirzepatide [facility]) 2.5 MG/0.5ML Pen Inject 2.5 mg into the skin once a week.   [DISCONTINUED] LANTUS SOLOSTAR 100 UNIT/ML Solostar Pen Inject 10 Units into the skin at bedtime.   LANTUS SOLOSTAR 100 UNIT/ML Solostar Pen Inject 12 Units into the skin at bedtime.   [DISCONTINUED] aspirin EC (BAYER LOW DOSE) 81 MG tablet Take 81 mg by mouth daily. (Patient not taking: Reported on 12/16/2023)   No facility-administered encounter medications on file as of 12/16/2023.    Past Medical History:  Diagnosis Date   Asthma    Diabetes mellitus without complication (HCC)    Folate deficiency 11/24/2021   Hypertension    Iron deficiency anemia 08/25/2020   Iron deficiency anemia secondary to blood loss (chronic)     Past Surgical History:  Procedure Laterality Date   CHOLECYSTECTOMY     KNEE ARTHROSCOPY W/ MENISCAL REPAIR Right 05/10/2023  Family History  Adopted: Yes    Social History   Socioeconomic History   Marital status: Married    Spouse name: Not on file   Number of children: 0   Years of education: Not on file   Highest education level: Not on file  Occupational History   Not on file  Tobacco Use   Smoking status: Never    Passive exposure: Never   Smokeless tobacco: Never  Vaping Use   Vaping status: Never Used  Substance and Sexual Activity   Alcohol use: Not Currently   Drug use: Never   Sexual activity: Not Currently  Other Topics Concern   Not on file   Social History Narrative   Not on file   Social Drivers of Health   Financial Resource Strain: Not on file  Food Insecurity: No Food Insecurity (12/16/2023)   Hunger Vital Sign    Worried About Running Out of Food in the Last Year: Never true    Ran Out of Food in the Last Year: Never true  Transportation Needs: No Transportation Needs (12/16/2023)   PRAPARE - Administrator, Civil Service (Medical): No    Lack of Transportation (Non-Medical): No  Physical Activity: Not on file  Stress: Not on file  Social Connections: Not on file  Intimate Partner Violence: Not At Risk (12/16/2023)   Humiliation, Afraid, Rape, and Kick questionnaire    Fear of Current or Ex-Partner: No    Emotionally Abused: No    Physically Abused: No    Sexually Abused: No    ROS  Per HPI      Objective    BP (!) 169/66 (BP Location: Right Wrist, Patient Position: Sitting, Cuff Size: Normal)   Pulse (!) 104   Temp 98.4 F (36.9 C) (Oral)   Ht 5' 0.24" (1.53 m)   Wt (!) 358 lb 8 oz (162.6 kg)   LMP 11/25/2023 (Approximate)   SpO2 96%   BMI 69.47 kg/m   Physical Exam Constitutional:      General: She is not in acute distress.    Appearance: Normal appearance. She is not ill-appearing.  HENT:     Head: Normocephalic and atraumatic.     Nose: Nose normal.     Mouth/Throat:     Mouth: Mucous membranes are moist.     Pharynx: Oropharynx is clear.  Eyes:     General: No scleral icterus.    Extraocular Movements: Extraocular movements intact.     Conjunctiva/sclera: Conjunctivae normal.     Pupils: Pupils are equal, round, and reactive to light.  Cardiovascular:     Rate and Rhythm: Normal rate and regular rhythm.     Pulses: Normal pulses.     Heart sounds: Normal heart sounds. No murmur heard.    No friction rub.  Pulmonary:     Effort: Pulmonary effort is normal. No respiratory distress.     Breath sounds: Normal breath sounds. No wheezing, rhonchi or rales.   Musculoskeletal:        General: Normal range of motion.     Right lower leg: No edema.     Left lower leg: No edema.  Skin:    General: Skin is warm and dry.     Coloration: Skin is not jaundiced or pale.  Neurological:     General: No focal deficit present.     Mental Status: She is alert.  Psychiatric:        Mood and Affect: Mood  normal.        Behavior: Behavior normal.         Assessment & Plan:   Encounter to establish care  Diabetes mellitus without complication (HCC) Assessment & Plan: Extremely poorly controlled, increasing lantus to 12 units nightly. Discussed titrating up insulin by 2 units daily based upon morning fasting glucose.   Order A1c to assess overall BG control  Order freestyle libre to allow for better control of BG  Order [facility], patient is an excellent candidate with high BG and Morbid obesity.  Patient is having symptoms of hyperglycemia. History of DKA.   Orders: -     FreeStyle Libre 3 Sensor; Place 1 each onto the skin every 14 (fourteen) days. Place 1 sensor on the skin every 14 days. Use to check glucose continuously  Dispense: 2 each; Refill: 5 -     FreeStyle Libre 3 Reader; 1 Device by Does not apply route daily.  Dispense: 1 each; Refill: 0 -     Tirzepatide; Inject 2.5 mg into the skin once a week.  Dispense: 2 mL; Refill: 4 -     Lantus SoloStar; Inject 12 Units into the skin at bedtime.  Dispense: 15 mL; Refill: 5 -     Hemoglobin A1c  Class 3 severe obesity due to excess calories with serious comorbidity and body mass index (BMI) of 60.0 to 69.9 in adult [facility]) -     FreeStyle Libre 3 Sensor; Place 1 each onto the skin every 14 (fourteen) days. Place 1 sensor on the skin every 14 days. Use to check glucose continuously  Dispense: 2 each; Refill: 5 -     FreeStyle Libre 3 Reader; 1 Device by Does not apply route daily.  Dispense: 1 each; Refill: 0 -     Tirzepatide; Inject 2.5 mg into the skin once a week.  Dispense: 2 mL; Refill:  4 -     Lipid panel  Morbid obesity (HCC) -     FreeStyle Libre 3 Sensor; Place 1 each onto the skin every 14 (fourteen) days. Place 1 sensor on the skin every 14 days. Use to check glucose continuously  Dispense: 2 each; Refill: 5 -     FreeStyle Libre 3 Reader; 1 Device by Does not apply route daily.  Dispense: 1 each; Refill: 0 -     Tirzepatide; Inject 2.5 mg into the skin once a week.  Dispense: 2 mL; Refill: 4 -     Lipid panel  Mild intermittent asthma without complication -     Albuterol Sulfate; Take 3 mLs (2.5 mg total) by nebulization every 6 (six) hours as needed for wheezing or shortness of breath.  Dispense: 150 mL; Refill: 1  Primary hypertension Assessment & Plan: Order olmesartan and will schedule lab visit 2 weeks out to assess electrolytes and Cr.  Patient has been advised to keep track of BP at home.  Orders: -     Olmesartan Medoxomil; Take 1 tablet (5 mg total) by mouth daily.  Dispense: 30 tablet; Refill: 4   I have spent greater than 45 minutes charting, educating, diagnosing and managing this patient for this visit.   Return in about 6 weeks (around 01/27/2024) for Chronic Followup.   Teryl Lucy Brittanni Cariker, PA-C

## 2023-12-17 LAB — HEMOGLOBIN A1C
Est. average glucose Bld gHb Est-mCnc: 349 mg/dL
Hgb A1c MFr Bld: 13.8 % — ABNORMAL HIGH (ref 4.8–5.6)

## 2023-12-17 LAB — LIPID PANEL
Chol/HDL Ratio: 5.2 {ratio} — ABNORMAL HIGH (ref 0.0–4.4)
Cholesterol, Total: 192 mg/dL (ref 100–199)
HDL: 37 mg/dL — ABNORMAL LOW (ref >39)
LDL Chol Calc (NIH): 129 mg/dL — ABNORMAL HIGH (ref 0–99)
Triglycerides: 145 mg/dL (ref 0–149)
VLDL Cholesterol Cal: 26 mg/dL (ref 5–40)

## 2023-12-18 ENCOUNTER — Other Ambulatory Visit (HOSPITAL_BASED_OUTPATIENT_CLINIC_OR_DEPARTMENT_OTHER): Payer: Self-pay | Admitting: Student

## 2023-12-18 DIAGNOSIS — E785 Hyperlipidemia, unspecified: Secondary | ICD-10-CM

## 2023-12-18 MED ORDER — ATORVASTATIN CALCIUM 20 MG PO TABS: 20.0000 mg | ORAL_TABLET | Freq: Every day | ORAL | 1 refills | Status: DC

## 2023-12-18 NOTE — Progress Notes (Signed)
Telephone call: Discussed increasing basal insulin to 20 units nightly at this time and letting me know what her fasting blood glucose is at in the morning.  I expect that we will have to increase until at least 30 units nightly. Also called in cholesterol med.

## 2023-12-23 ENCOUNTER — Encounter (HOSPITAL_BASED_OUTPATIENT_CLINIC_OR_DEPARTMENT_OTHER): Payer: Self-pay | Admitting: Student

## 2023-12-24 ENCOUNTER — Other Ambulatory Visit (HOSPITAL_BASED_OUTPATIENT_CLINIC_OR_DEPARTMENT_OTHER): Payer: Self-pay | Admitting: Student

## 2023-12-24 DIAGNOSIS — E119 Type 2 diabetes mellitus without complications: Secondary | ICD-10-CM

## 2023-12-24 MED ORDER — EMPAGLIFLOZIN 10 MG PO TABS: 10.0000 mg | ORAL_TABLET | Freq: Every day | ORAL | 1 refills | Status: DC

## 2023-12-25 ENCOUNTER — Encounter (HOSPITAL_BASED_OUTPATIENT_CLINIC_OR_DEPARTMENT_OTHER): Payer: Self-pay | Admitting: Student

## 2023-12-27 ENCOUNTER — Encounter (HOSPITAL_BASED_OUTPATIENT_CLINIC_OR_DEPARTMENT_OTHER): Payer: Self-pay | Admitting: Student

## 2023-12-30 ENCOUNTER — Ambulatory Visit (HOSPITAL_BASED_OUTPATIENT_CLINIC_OR_DEPARTMENT_OTHER): Payer: BC Managed Care – PPO

## 2024-01-27 ENCOUNTER — Encounter: Payer: Self-pay | Admitting: Hematology and Oncology

## 2024-01-27 ENCOUNTER — Ambulatory Visit (HOSPITAL_BASED_OUTPATIENT_CLINIC_OR_DEPARTMENT_OTHER): Payer: BC Managed Care – PPO | Admitting: Student

## 2024-01-27 ENCOUNTER — Encounter (HOSPITAL_BASED_OUTPATIENT_CLINIC_OR_DEPARTMENT_OTHER): Payer: Self-pay | Admitting: Student

## 2024-01-27 ENCOUNTER — Other Ambulatory Visit (HOSPITAL_BASED_OUTPATIENT_CLINIC_OR_DEPARTMENT_OTHER): Payer: Self-pay

## 2024-01-27 DIAGNOSIS — E119 Type 2 diabetes mellitus without complications: Secondary | ICD-10-CM | POA: Diagnosis not present

## 2024-01-27 DIAGNOSIS — Z23 Encounter for immunization: Secondary | ICD-10-CM | POA: Diagnosis not present

## 2024-01-27 DIAGNOSIS — I10 Essential (primary) hypertension: Secondary | ICD-10-CM | POA: Diagnosis not present

## 2024-01-27 DIAGNOSIS — M545 Low back pain, unspecified: Secondary | ICD-10-CM | POA: Diagnosis not present

## 2024-01-27 DIAGNOSIS — Z6841 Body Mass Index (BMI) 40.0 and over, adult: Secondary | ICD-10-CM

## 2024-01-27 MED ORDER — CYCLOBENZAPRINE HCL 5 MG PO TABS
5.0000 mg | ORAL_TABLET | Freq: Three times a day (TID) | ORAL | 1 refills | Status: DC | PRN
  Filled 2024-01-27: qty 30, 10d supply, fill #0

## 2024-01-27 MED ORDER — MELOXICAM 7.5 MG PO TABS
7.5000 mg | ORAL_TABLET | Freq: Every day | ORAL | 0 refills | Status: AC
  Filled 2024-01-27: qty 30, 30d supply, fill #0

## 2024-01-27 MED ORDER — LANTUS SOLOSTAR 100 UNIT/ML ~~LOC~~ SOPN
PEN_INJECTOR | SUBCUTANEOUS | 5 refills | Status: DC
  Filled 2024-01-27: qty 15, 30d supply, fill #0

## 2024-01-27 NOTE — Assessment & Plan Note (Signed)
 Order meloxicam and muscle relaxer to decrease inflammation and pain at site.  Suspect that this is a strain secondary to lifting.  Discussed lifestyle intervention.

## 2024-01-27 NOTE — Assessment & Plan Note (Signed)
 Still poorly controlled on Lantus 40 units nightly.  Beginning Lantus twice daily-40 units nightly and 10 units in the morning.  Heavily discussed lifestyle intervention.

## 2024-01-27 NOTE — Progress Notes (Signed)
 Established Patient Office Visit  Subjective   Patient ID: Leah Mendoza, female    DOB: 06/13/1976  Age: 48 y.o. MRN: [id number]  Chief Complaint  Patient presents with   Medical Management of Chronic Issues    Here for follow up.Could not get jardiance or mounjaro. Copays were too high with insurance.    Back Pain    Having back pain on left side for 1 week now. Has a child at work that cannot walk and might have picked her up wrong.    HPI  Diabetes - no hypoglycemic events. No noted wounds or sores that are not healing well. Checking glucose at home-still elevated. Taking medications as prescribed without any side effects.  Discussed beginning further insulin therapy.  Could not get Jardiance or Mounjaro due to co-pays being too high with insurance.  Discussed lifestyle intervention.  Hypertension- Pt denies chest pain, SOB, dizziness, or heart palpitations.  Taking meds w/o problems.  Denies medication side effects.    Back Pain- Hurt on her left side. Picked up little girl at work and wonders if she strained her back. Feels like muscle spasms. Agreeable to muscle relaxer and NSAID therapy. No red flags noted. Denies saddle anesthesia, bowel, and bladder issues.    ROS Negative unless indicated in HPI.    Objective:     BP 136/77 (BP Location: Right Arm, Patient Position: Sitting, Cuff Size: Large)   Pulse 72   Temp 98.1 F (36.7 C) (Oral)   Ht 5' (1.524 m)   Wt (!) 362 lb 12.8 oz (164.6 kg)   LMP 01/17/2024 (Exact Date)   SpO2 97%   BMI 70.85 kg/m    Physical Exam Constitutional:      Appearance: Normal appearance. She is obese.  HENT:     Head: Normocephalic and atraumatic.     Right Ear: External ear normal.     Left Ear: External ear normal.     Nose: Nose normal.  Eyes:     Conjunctiva/sclera: Conjunctivae normal.  Cardiovascular:     Rate and Rhythm: Normal rate and regular rhythm.     Heart sounds: Normal heart sounds. No murmur  heard.    No friction rub.  Pulmonary:     Effort: Pulmonary effort is normal. No respiratory distress.     Breath sounds: Normal breath sounds. No wheezing, rhonchi or rales.  Musculoskeletal:        General: Tenderness present. Normal range of motion.     Comments: Tenderness to palpation of left paraspinous muscles. No point tenderness to spinal column.  Neurological:     General: No focal deficit present.     Mental Status: She is alert.      No results found for any visits on 01/27/24.    The 10-year ASCVD risk score (Arnett DK, et al., 2019) is: 14.6%    Assessment & Plan:   Morbid obesity (HCC) Assessment & Plan: BMI today is 70.85-discussed lifestyle intervention for diabetes which would also aid in weight loss.   BMI 70 and over, adult [facility])  Need for Tdap vaccination -     Tdap vaccine greater than or equal to 7yo IM  Diabetes mellitus without complication (HCC) Assessment & Plan: Still poorly controlled on Lantus 40 units nightly.  Beginning Lantus twice daily-40 units nightly and 10 units in the morning.  Heavily discussed lifestyle intervention.  Orders: -     Lantus SoloStar; Inject 40 Units into the skin at bedtime  AND 10 Units in the morning.  Dispense: 15 mL; Refill: 5  Acute left-sided low back pain without sciatica Assessment & Plan: Order meloxicam and muscle relaxer to decrease inflammation and pain at site.  Suspect that this is a strain secondary to lifting.  Discussed lifestyle intervention.  Orders: -     Meloxicam; Take 1 tablet (7.5 mg total) by mouth daily.  Dispense: 30 tablet; Refill: 0 -     Cyclobenzaprine HCl; Take 1 tablet (5 mg total) by mouth 3 (three) times daily as needed for muscle spasms.  Dispense: 30 tablet; Refill: 1  Primary hypertension Assessment & Plan: Stable-continue current regimen.    I have spent greater than 20 minutes charting, educating, diagnosing and managing this patient for this visit.   Return in  about 3 months (around 04/28/2024) for DM.    Teryl Lucy Raynold Blankenbaker, PA-C

## 2024-01-27 NOTE — Assessment & Plan Note (Signed)
 BMI today is 70.85-discussed lifestyle intervention for diabetes which would also aid in weight loss.

## 2024-01-27 NOTE — Assessment & Plan Note (Signed)
 Stable continue current regimen.

## 2024-01-27 NOTE — Patient Instructions (Signed)
 It was nice to see you today!  As we discussed in clinic I need you to severely decrease your carb intake such as Posta, bread, rice, potatoes, and sugar sweetened beverages.  Will be trying 40 units of Lantus insulin at night and 10 units of Lantus insulin in the morning, if you are not having any issues with this please let me know.  Please let me know how your fasting blood sugars are in around 1 to 2 weeks.  I would advise you to continue to take your blood glucose twice daily.  If your blood sugar is not getting much better by the time I see you next, we will likely be sending you to an endocrinologist so that they can work on a better insulin regimen for you.  If you have any problems before your next visit feel free to message me via MyChart (minor issues or questions) or call the office, otherwise you may reach out to schedule an office visit.  Thank you! Gerilyn Pilgrim Colby Reels, PA-C

## 2024-01-28 ENCOUNTER — Inpatient Hospital Stay: Payer: BC Managed Care – PPO | Admitting: Oncology

## 2024-01-28 ENCOUNTER — Inpatient Hospital Stay: Payer: BC Managed Care – PPO

## 2024-01-28 DIAGNOSIS — D500 Iron deficiency anemia secondary to blood loss (chronic): Secondary | ICD-10-CM

## 2024-02-19 ENCOUNTER — Inpatient Hospital Stay

## 2024-02-19 ENCOUNTER — Inpatient Hospital Stay: Admitting: Oncology

## 2024-03-02 DIAGNOSIS — E1165 Type 2 diabetes mellitus with hyperglycemia: Secondary | ICD-10-CM | POA: Diagnosis not present

## 2024-03-02 DIAGNOSIS — E039 Hypothyroidism, unspecified: Secondary | ICD-10-CM | POA: Diagnosis not present

## 2024-03-02 DIAGNOSIS — E1110 Type 2 diabetes mellitus with ketoacidosis without coma: Secondary | ICD-10-CM | POA: Diagnosis not present

## 2024-03-02 DIAGNOSIS — I10 Essential (primary) hypertension: Secondary | ICD-10-CM | POA: Diagnosis not present

## 2024-03-02 DIAGNOSIS — R110 Nausea: Secondary | ICD-10-CM | POA: Diagnosis not present

## 2024-03-11 ENCOUNTER — Other Ambulatory Visit: Payer: Self-pay | Admitting: Oncology

## 2024-03-11 ENCOUNTER — Inpatient Hospital Stay (HOSPITAL_BASED_OUTPATIENT_CLINIC_OR_DEPARTMENT_OTHER): Admitting: Oncology

## 2024-03-11 ENCOUNTER — Encounter: Payer: Self-pay | Admitting: Oncology

## 2024-03-11 ENCOUNTER — Inpatient Hospital Stay: Attending: Oncology

## 2024-03-11 ENCOUNTER — Telehealth: Payer: Self-pay | Admitting: Oncology

## 2024-03-11 VITALS — BP 158/88 | HR 80 | Temp 97.9°F | Resp 18 | Ht 60.0 in | Wt 372.5 lb

## 2024-03-11 DIAGNOSIS — D509 Iron deficiency anemia, unspecified: Secondary | ICD-10-CM | POA: Insufficient documentation

## 2024-03-11 DIAGNOSIS — Z79899 Other long term (current) drug therapy: Secondary | ICD-10-CM | POA: Insufficient documentation

## 2024-03-11 DIAGNOSIS — D500 Iron deficiency anemia secondary to blood loss (chronic): Secondary | ICD-10-CM | POA: Diagnosis not present

## 2024-03-11 DIAGNOSIS — N920 Excessive and frequent menstruation with regular cycle: Secondary | ICD-10-CM | POA: Insufficient documentation

## 2024-03-11 LAB — CBC WITH DIFFERENTIAL (CANCER CENTER ONLY)
Abs Immature Granulocytes: 0.02 10*3/uL (ref 0.00–0.07)
Basophils Absolute: 0.1 10*3/uL (ref 0.0–0.1)
Basophils Relative: 1 %
Eosinophils Absolute: 0.2 10*3/uL (ref 0.0–0.5)
Eosinophils Relative: 3 %
HCT: 40 % (ref 36.0–46.0)
Hemoglobin: 13.3 g/dL (ref 12.0–15.0)
Immature Granulocytes: 0 %
Lymphocytes Relative: 27 %
Lymphs Abs: 2.6 10*3/uL (ref 0.7–4.0)
MCH: 29.1 pg (ref 26.0–34.0)
MCHC: 33.3 g/dL (ref 30.0–36.0)
MCV: 87.5 fL (ref 80.0–100.0)
Monocytes Absolute: 0.7 10*3/uL (ref 0.1–1.0)
Monocytes Relative: 7 %
Neutro Abs: 6.1 10*3/uL (ref 1.7–7.7)
Neutrophils Relative %: 62 %
Platelet Count: 359 10*3/uL (ref 150–400)
RBC: 4.57 MIL/uL (ref 3.87–5.11)
RDW: 12.3 % (ref 11.5–15.5)
WBC Count: 9.7 10*3/uL (ref 4.0–10.5)
nRBC: 0 % (ref 0.0–0.2)
nRBC: 0 /100{WBCs}

## 2024-03-11 LAB — CMP (CANCER CENTER ONLY)
ALT: 13 U/L (ref 0–44)
AST: 15 U/L (ref 15–41)
Albumin: 3.7 g/dL (ref 3.5–5.0)
Alkaline Phosphatase: 91 U/L (ref 38–126)
Anion gap: 12 (ref 5–15)
BUN: 13 mg/dL (ref 6–20)
CO2: 27 mmol/L (ref 22–32)
Calcium: 9.5 mg/dL (ref 8.9–10.3)
Chloride: 97 mmol/L — ABNORMAL LOW (ref 98–111)
Creatinine: 0.69 mg/dL (ref 0.44–1.00)
GFR, Estimated: >60 mL/min (ref >60)
Glucose, Bld: 218 mg/dL — ABNORMAL HIGH (ref 70–99)
Potassium: 4.2 mmol/L (ref 3.5–5.1)
Sodium: 136 mmol/L (ref 135–145)
Total Bilirubin: 0.2 mg/dL (ref 0.0–1.2)
Total Protein: 7 g/dL (ref 6.5–8.1)

## 2024-03-11 LAB — IRON AND TIBC
Iron: 32 ug/dL (ref 28–170)
Saturation Ratios: 9 % — ABNORMAL LOW (ref 10.4–31.8)
TIBC: 377 ug/dL (ref 250–450)
UIBC: 345 ug/dL

## 2024-03-11 LAB — FERRITIN: Ferritin: 9 ng/mL — ABNORMAL LOW (ref 11–307)

## 2024-03-11 NOTE — Progress Notes (Signed)
 [facility]  [address] [place],  [place]  [address] [phone]  Clinic Day:  03/11/24  Referring physician: Lonie Roa, MD  ASSESSMENT & PLAN:  Assessment: Iron  deficiency anemia Longstanding history of iron  deficiency anemia with associated reactive thrombocytosis felt to be due to menorrhagia.  She has periodically received IV iron , as she does not tolerate oral iron .  She was found recurrent iron  deficiency in October when she had recurrent thrombocytosis, so received IV iron  again in November, 2024. I referred her back to gynecology for further recommendations regarding her menorrhagia and she is scheduled to be seen.  We also referred her to Dr. Monico Anna for screening colonoscopy and this is scheduled for January, 2025  Plan: She had a screening bilateral mammogram done on 09/27/2023 which was clear. She has a WBC of 9.7, hemoglobin of 13.3, and platelet count of 359,000. Her CMP is normal other than a elevated glucose of 218. Her ferritin and iron  studies are pending. Her last IV iron  was in November, 2024. She informed me that her last menstrual cycle was light and they are not regular. I will see her back in 6 months with CBC, CMP, ferritin, and iron  studies. The patient understands the plans discussed today and is in agreement with them.  She knows to contact our office if she develops concerns prior to her next appointment.  I provided 10 minutes of face-to-face time during this encounter and > 50% was spent counseling as documented under my assessment and plan.   Leah Baumgartner, MD  [facility] Leah Mendoza - A DEPT OF [facility] [address] [place] [address] Dept: [phone] Dept Fax: [phone]   No orders of the defined types were placed in this encounter.    CHIEF COMPLAINT:  CC: Iron  deficiency anemia  Current Treatment:  IV iron  as needed  HISTORY OF PRESENT ILLNESS:  Leah Mendoza is a 48 y.o. female with microcytic anemia secondary to iron  deficiency with associated reactive thrombocytosis who we began seeing in August 2014.  This has been an ongoing problem for years, felt to be due to menorrhagia.  She does not tolerate oral iron  supplementation, so receives IV iron  on an as needed basis.  She has been referred to gynecology in the past, but apparently there are financial issues.  She had an EGD in April 2013 with Dr. Monico Anna for upper abdominal pain, which did not reveal any abnormality.  She had colonoscopy with Dr. Randal Bury in June 2015, which was also normal.  Routine screening colonoscopy at age 19 was recommended, but now that has changed to age 48, so she likely will have repeat screening colonoscopy at age 84, which would be 10 years from the last.  She has received numerous IV iron  infusions over the years when she becomes more anemic and symptomatic.  CT abdomen and pelvis in June 2015 revealed endometrial thickening, for which we referred her to [facility] in June 2015, but she never went.  Repeat CT abdomen and pelvis and February 2016 in the emergency department revealed a left adnexal soft tissue density most consistent with an ovarian cyst. Pelvic ultrasound was recommended and revealed bilateral ovarian cysts. The endometrium looked normal on the ultrasound.  She was seen by GYN in 2019, but has not followed up.   She has required IV iron  regularly over the years.  Bilateral screening mammogram in January 2023 did not reveal  any evidence of malignancy.  She was found to have folate deficiency, which resolved with folic acid  daily.  Her ferritin dropped to 9 in October 29, so she was given receiving IV iron  in the form of Venofer  again. We referred her back to GYN for persistent menorrhagia, as well as Dr. Monico Anna for screening colonoscopy.  She developed cellulitis at the IV site in the right antecubital area after iron  sucrose on November 13.   She was given IM Rocephin  and placed on cephalexin  500 mg 3 times daily.  Due to the significant swelling, she had a stat ultrasound of the right upper extremity that was negative for DVT. The cellulitis resolved with antibiotic and she completed 5 doses of iron  sucrose.  Bilateral screening mammogram in November 2024 was negative.  INTERVAL HISTORY:  Lyfe is here today for repeat clinical assessment for her iron  deficiency anemia. Patient states that she feels well and has no complaints of pain. She had a screening bilateral mammogram done on 09/27/2023 which was clear. She has a WBC of 9.7, hemoglobin of 13.3, and platelet count of 359,000. Her CMP is normal other than a elevated glucose of 218. Her ferritin and iron  studies are pending. Her last IV iron  was in November, 2024. She informed me that her last menstrual cycle was light and they are not regular. I will see her back in 6 months with CBC, CMP, ferritin, and iron  studies.   She denies fever, chills, night sweats, or other signs of infection. She denies cardiorespiratory and gastrointestinal issues. She denies pain. Her appetite is good. Her appetite is ok and her weight has decreased 12 pounds over last 5 months .   REVIEW OF SYSTEMS:  Review of Systems  Constitutional:  Negative for appetite change, chills, fatigue, fever and unexpected weight change.  HENT:  Negative.  Negative for lump/mass, mouth sores and sore throat.   Eyes: Negative.   Respiratory: Negative.  Negative for chest tightness, cough, hemoptysis, shortness of breath and wheezing.   Cardiovascular: Negative.  Negative for chest pain, leg swelling and palpitations.  Gastrointestinal: Negative.  Negative for abdominal distention, abdominal pain, blood in stool, constipation, diarrhea, nausea and vomiting.  Endocrine: Negative.  Negative for hot flashes.  Genitourinary:  Positive for menstrual problem. Negative for difficulty urinating, dysuria, frequency and hematuria.    Musculoskeletal:  Negative for arthralgias, back pain, flank pain, gait problem and myalgias.  Skin: Negative.  Negative for rash.  Neurological:  Negative for dizziness, extremity weakness, gait problem, headaches, light-headedness, numbness, seizures and speech difficulty.  Hematological: Negative.  Negative for adenopathy. Does not bruise/bleed easily.  Psychiatric/Behavioral: Negative.  Negative for depression and sleep disturbance. The patient is not nervous/anxious.     VITALS:  Blood pressure (!) 158/88, pulse 80, temperature 97.9 F (36.6 C), temperature source Oral, resp. rate 18, height 5' (1.524 m), weight (!) 372 lb 8 oz (169 kg), SpO2 98%.  Wt Readings from Last 3 Encounters:  03/16/24 (!) 365 lb 14.4 oz (166 kg)  03/11/24 (!) 372 lb 8 oz (169 kg)  01/27/24 (!) 362 lb 12.8 oz (164.6 kg)    Body mass index is 72.75 kg/m.  Performance status (ECOG): 0 - Asymptomatic  PHYSICAL EXAM:  Physical Exam Vitals and nursing note reviewed.  Constitutional:      General: She is not in acute distress.    Appearance: Normal appearance. She is normal weight. She is not ill-appearing, toxic-appearing or diaphoretic.  HENT:     Head:  Normocephalic and atraumatic.     Right Ear: Tympanic membrane, ear canal and external ear normal. There is no impacted cerumen.     Left Ear: Tympanic membrane, ear canal and external ear normal. There is no impacted cerumen.     Nose: Nose normal. No congestion or rhinorrhea.     Mouth/Throat:     Mouth: Mucous membranes are moist.     Pharynx: Oropharynx is clear. No oropharyngeal exudate or posterior oropharyngeal erythema.  Eyes:     General: No scleral icterus.       Right eye: No discharge.        Left eye: No discharge.     Extraocular Movements: Extraocular movements intact.     Conjunctiva/sclera: Conjunctivae normal.     Pupils: Pupils are equal, round, and reactive to light.  Cardiovascular:     Rate and Rhythm: Normal rate and regular  rhythm.     Pulses: Normal pulses.     Heart sounds: Normal heart sounds. No murmur heard.    No friction rub. No gallop.  Pulmonary:     Effort: Pulmonary effort is normal.     Breath sounds: Normal breath sounds. No wheezing, rhonchi or rales.  Abdominal:     General: Bowel sounds are normal. There is no distension.     Palpations: Abdomen is soft. There is no mass.     Tenderness: There is no abdominal tenderness.     Comments: Difficult to assess for hepatosplenomegaly due to body habitus  Musculoskeletal:        General: Normal range of motion.     Cervical back: Normal range of motion and neck supple. No tenderness.     Right lower leg: Edema (trace) present.     Left lower leg: Edema (trace) present.  Lymphadenopathy:     Cervical: No cervical adenopathy.     Upper Body:     Right upper body: No supraclavicular or axillary adenopathy.     Left upper body: No supraclavicular or axillary adenopathy.     Lower Body: No right inguinal adenopathy. No left inguinal adenopathy.  Skin:    General: Skin is warm and dry.     Coloration: Skin is not jaundiced.     Findings: No rash.  Neurological:     General: No focal deficit present.     Mental Status: She is alert and oriented to person, place, and time. Mental status is at baseline.     Cranial Nerves: No cranial nerve deficit.     Sensory: No sensory deficit.     Motor: No weakness.     Coordination: Coordination normal.     Gait: Gait normal.     Deep Tendon Reflexes: Reflexes normal.  Psychiatric:        Mood and Affect: Mood normal.        Behavior: Behavior normal.        Thought Content: Thought content normal.        Judgment: Judgment normal.    LABS:      Latest Ref Rng & Units 03/11/2024    3:06 PM 10/30/2023    2:37 PM 09/17/2023    2:27 PM  CBC  WBC 4.0 - 10.5 K/uL 9.7  10.4  13.4   Hemoglobin 12.0 - 15.0 g/dL 16.1  09.6  04.5   Hematocrit 36.0 - 46.0 % 40.0  41.7  45.3   Platelets 150 - 400 K/uL 359   326  449  Latest Ref Rng & Units 03/11/2024    3:06 PM 10/30/2023    2:37 PM 09/17/2023    2:27 PM  CMP  Glucose 70 - 99 mg/dL 161  096  045   BUN 6 - 20 mg/dL 13  15  15    Creatinine 0.44 - 1.00 mg/dL 4.09  8.11  9.14   Sodium 135 - 145 mmol/L 136  137  136   Potassium 3.5 - 5.1 mmol/L 4.2  4.1  4.2   Chloride 98 - 111 mmol/L 97  98  97   CO2 22 - 32 mmol/L 27  27  29    Calcium  8.9 - 10.3 mg/dL 9.5  9.1  9.3   Total Protein 6.5 - 8.1 g/dL 7.0  7.3  8.2   Total Bilirubin 0.0 - 1.2 mg/dL 0.2  0.3  0.6   Alkaline Phos 38 - 126 U/L 91  105  85   AST 15 - 41 U/L 15  11  13    ALT 0 - 44 U/L 13  16  20     Lab Results  Component Value Date   TIBC 377 03/11/2024   TIBC 385 10/30/2023   TIBC 455 (H) 09/17/2023   FERRITIN 9 (L) 03/11/2024   FERRITIN 65 10/30/2023   FERRITIN 9 (L) 09/17/2023   IRONPCTSAT 9 (L) 03/11/2024   IRONPCTSAT 20 10/30/2023   IRONPCTSAT 19 09/17/2023   Lab Results  Component Value Date   FOLATE 6.6 09/17/2023   STUDIES:     HISTORY:   Past Medical History:  Diagnosis Date   Asthma    Diabetes mellitus without complication (HCC)    Folate deficiency 11/24/2021   Hypertension    Iron  deficiency anemia 08/25/2020   Iron  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 She has never been exposed to tobacco smoke. She has never used smokeless tobacco. She reports that she does not currently use alcohol. She reports that she does not use drugs.The patient is alone today.  Allergies:  Allergies  Allergen Reactions   Latex     UNKNOWN    Current Medications: Current Outpatient Medications  Medication Sig Dispense Refill   albuterol  (PROVENTIL ) (2.5 MG/3ML) 0.083% nebulizer solution Take 3 mLs (2.5 mg total) by  nebulization every 6 (six) hours as needed for wheezing or shortness of breath. 150 mL 1   albuterol  (VENTOLIN  HFA) 108 (90 Base) MCG/ACT inhaler Inhale 2 puffs into the lungs every 6 (six) hours as needed for wheezing or shortness of breath. 6.7 g 0   atorvastatin  (LIPITOR) 20 MG tablet Take 1 tablet (20 mg total) by mouth daily. 90 tablet 1   Continuous Glucose Receiver (FREESTYLE LIBRE 3 READER) DEVI 1 Device by Does not apply route daily. 1 each 0   Continuous Glucose Sensor (FREESTYLE LIBRE 3 SENSOR) MISC Place 1 each onto the skin every 14 (fourteen) days. Place 1 sensor on the skin every 14 days. Use to check glucose continuously 2 each 5   cyclobenzaprine  (FLEXERIL ) 5 MG tablet Take 1 tablet (5 mg total) by mouth 3 (three) times daily as needed for muscle spasms. 30 tablet 1   empagliflozin  (JARDIANCE ) 10 MG TABS tablet Take 1 tablet (10 mg total) by mouth daily.  90 tablet 1   Iron -Vitamin C (VITRON-C PO) Take by mouth daily.     LANTUS  SOLOSTAR 100 UNIT/ML Solostar Pen Inject 40 Units into the skin at bedtime AND 10 Units in the morning. 15 mL 5   meloxicam  (MOBIC ) 7.5 MG tablet Take 1 tablet (7.5 mg total) by mouth daily. 30 tablet 0   metFORMIN (GLUCOPHAGE-XR) 500 MG 24 hr tablet Take 500 mg by mouth 2 (two) times daily.     olmesartan  (BENICAR ) 5 MG tablet Take 1 tablet (5 mg total) by mouth daily. 30 tablet 4   No current facility-administered medications for this visit.     I,Jasmine M Lassiter,acting as a scribe for Leah Baumgartner, MD.,have documented all relevant documentation on the behalf of Leah Baumgartner, MD,as directed by  Leah Baumgartner, MD while in the presence of Leah Baumgartner, MD.

## 2024-03-11 NOTE — Telephone Encounter (Signed)
 Patient has been scheduled for follow-up visit per 03/11/24 LOS.  Pt aware of scheduled appt details.

## 2024-03-13 ENCOUNTER — Other Ambulatory Visit: Payer: Self-pay | Admitting: Hematology and Oncology

## 2024-03-16 ENCOUNTER — Encounter (HOSPITAL_BASED_OUTPATIENT_CLINIC_OR_DEPARTMENT_OTHER): Payer: Self-pay | Admitting: Student

## 2024-03-16 ENCOUNTER — Other Ambulatory Visit (HOSPITAL_BASED_OUTPATIENT_CLINIC_OR_DEPARTMENT_OTHER): Payer: Self-pay

## 2024-03-16 ENCOUNTER — Ambulatory Visit (HOSPITAL_BASED_OUTPATIENT_CLINIC_OR_DEPARTMENT_OTHER): Admitting: Student

## 2024-03-16 VITALS — BP 150/89 | HR 74 | Temp 98.4°F | Resp 17 | Ht 60.63 in | Wt 365.9 lb

## 2024-03-16 DIAGNOSIS — E119 Type 2 diabetes mellitus without complications: Secondary | ICD-10-CM | POA: Diagnosis not present

## 2024-03-16 DIAGNOSIS — Z111 Encounter for screening for respiratory tuberculosis: Secondary | ICD-10-CM | POA: Diagnosis not present

## 2024-03-16 DIAGNOSIS — J4520 Mild intermittent asthma, uncomplicated: Secondary | ICD-10-CM

## 2024-03-16 MED ORDER — ALBUTEROL SULFATE HFA 108 (90 BASE) MCG/ACT IN AERS
2.0000 | INHALATION_SPRAY | Freq: Four times a day (QID) | RESPIRATORY_TRACT | 0 refills | Status: AC | PRN
  Filled 2024-03-16: qty 6.7, 25d supply, fill #0

## 2024-03-16 NOTE — Patient Instructions (Addendum)
 It was nice to see you today!  As we discussed in clinic:  To get the copay card for jardiance :  [email or web address]  Ask insurance if they will pay for any injectables for weight loss.  If you have any problems before your next visit feel free to message me via MyChart (minor issues or questions) or call the office, otherwise you may reach out to schedule an office visit.  Thank you! Francisca Langenderfer, PA-C

## 2024-03-16 NOTE — Progress Notes (Signed)
 Acute Office Visit  Subjective:     Patient ID: Leah Mendoza, female    DOB: [birth year], 48 y.o.   MRN: [id number]  Chief Complaint  Patient presents with   need TB test    Pt. Is requesting a TB test for work. Pt. Stats that she needs to have a questionnaire completed for work also.     HPI  Discussed the use of AI scribe software for clinical note transcription with the patient, who gave verbal consent to proceed.  History of Present Illness   Leah Mendoza is a 48 year old female with hypertension and diabetes who presents for follow-up on her blood pressure and diabetes management.  Her blood pressure has been slightly elevated at home, with readings around 140 mmHg. She is not taking any medications that would affect her blood pressure and has not been using a nebulizer, although she has been prescribed medication for it. She has asthma but describes it as 'not that bad'. No unexplained cough, fever, night sweats, shortness of breath, chest pain, unintentional weight loss, or unexplained fatigue.  She has diabetes with a recent glucose level of 218 mg/dL. She is attempting to manage her weight by reducing rice and processed foods, resulting in a few pounds of weight loss. She has tried medications such as Ozempic and Mounjaro, but insurance issues have prevented continued use. She has not yet tried Jardiance  or a copay card for it.  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 She is able to run around after the kids and does not report any physical limitations due to her weight. She has not seen a nutritionist yet.      Reviewed prior hematology note from 10/30/23.  ROS Per HPI     Objective:    BP (!) 150/89   Pulse 74   Temp 98.4 F (36.9 C) (Oral)   Resp 17   Ht 5' 0.63" (1.54 m)   Wt (!) 365 lb 14.4 oz (166 kg)   LMP 02/14/2024 (Approximate)   SpO2 97%   BMI 69.98 kg/m    Physical Exam Constitutional:       General: She is not in acute distress.    Appearance: Normal appearance. She is not ill-appearing.  HENT:     Head: Normocephalic and atraumatic.     Nose: Nose normal.  Eyes:     General: No scleral icterus.    Conjunctiva/sclera: Conjunctivae normal.  Cardiovascular:     Rate and Rhythm: Normal rate and regular rhythm.     Heart sounds: Normal heart sounds. No murmur heard.    No friction rub.  Pulmonary:     Effort: Pulmonary effort is normal. No respiratory distress.     Breath sounds: Normal breath sounds. No wheezing, rhonchi or rales.  Musculoskeletal:        General: Normal range of motion.  Skin:    General: Skin is warm and dry.     Coloration: Skin is not jaundiced or pale.  Neurological:     General: No focal deficit present.     Mental Status: She is alert.  Psychiatric:        Mood and Affect: Mood normal.        Behavior: Behavior normal.     No results found for any visits on 03/16/24.      Assessment & Plan:   Assessment and Plan    Diabetes Mellitus Diabetes management  is suboptimal with recent glucose levels around 218 mg/dL. Previous attempts to use GLP-1 agonists like Ozempic and Mounjaro were unsuccessful due to insurance coverage issues. Discussed the potential use of Jardiance  with a copay card to reduce costs. Encouraged dietary modifications, which have resulted in some weight loss. Emphasized the importance of GLP-1 agonists for better management and advised to check insurance coverage for weight loss injectables like Wegovy or Zepbound. - Provide link for Jardiance  copay card - Encourage dietary modifications - Refer to a nutritionist for meal planning - Instruct to call insurance regarding coverage for weight loss injectables - Follow up in three months  Hypertension Blood pressure is slightly elevated today with home readings around 140 mmHg. No acute symptoms reported. - Continue olmesartan  5mg . - Contact if BP remains  elevated.  Asthma Asthma is present but not severe. No current inhaler or nebulizer provided. Discussed the importance of having a inhaler on hand for management. - Provide albuterol  inhaler    Screening with PPD for TB due to starting new job at a daycare.  Return if symptoms worsen or fail to improve.  Veera Stapleton T Monte Bronder, PA-C

## 2024-03-18 LAB — TB SKIN TEST
Induration: 0 mm
TB Skin Test: NEGATIVE

## 2024-03-22 ENCOUNTER — Encounter: Payer: Self-pay | Admitting: Hematology and Oncology

## 2024-03-26 ENCOUNTER — Telehealth: Payer: Self-pay | Admitting: Oncology

## 2024-03-26 NOTE — Telephone Encounter (Signed)
 Contacted pt to schedule an appt for IV Iron , Venofer . Authorization received from insurance. Unable to reach via phone, voicemail was left.

## 2024-03-27 ENCOUNTER — Encounter (HOSPITAL_BASED_OUTPATIENT_CLINIC_OR_DEPARTMENT_OTHER): Payer: Self-pay | Admitting: Student

## 2024-03-27 ENCOUNTER — Telehealth: Payer: Self-pay | Admitting: Oncology

## 2024-03-27 NOTE — Telephone Encounter (Signed)
 Patient has been scheduled for follow-up visit per 03/26/24 LOS.  Pt aware of scheduled appt details.

## 2024-03-31 ENCOUNTER — Other Ambulatory Visit (HOSPITAL_BASED_OUTPATIENT_CLINIC_OR_DEPARTMENT_OTHER): Payer: Self-pay | Admitting: Student

## 2024-03-31 DIAGNOSIS — E1165 Type 2 diabetes mellitus with hyperglycemia: Secondary | ICD-10-CM

## 2024-03-31 DIAGNOSIS — Z6841 Body Mass Index (BMI) 40.0 and over, adult: Secondary | ICD-10-CM

## 2024-03-31 MED ORDER — OZEMPIC (0.25 OR 0.5 MG/DOSE) 2 MG/3ML ~~LOC~~ SOPN: 0.2500 mg | PEN_INJECTOR | SUBCUTANEOUS | 6 refills | Status: DC

## 2024-04-01 ENCOUNTER — Inpatient Hospital Stay: Attending: Oncology

## 2024-04-01 ENCOUNTER — Other Ambulatory Visit (HOSPITAL_BASED_OUTPATIENT_CLINIC_OR_DEPARTMENT_OTHER): Payer: Self-pay

## 2024-04-01 VITALS — BP 134/61 | HR 77 | Temp 98.1°F | Resp 18

## 2024-04-01 DIAGNOSIS — D509 Iron deficiency anemia, unspecified: Secondary | ICD-10-CM | POA: Diagnosis not present

## 2024-04-01 DIAGNOSIS — D500 Iron deficiency anemia secondary to blood loss (chronic): Secondary | ICD-10-CM

## 2024-04-01 MED ORDER — IRON SUCROSE 20 MG/ML IV SOLN
200.0000 mg | Freq: Once | INTRAVENOUS | Status: AC
  Administered 2024-04-01: 200 mg via INTRAVENOUS
  Filled 2024-04-01: qty 10

## 2024-04-01 NOTE — Patient Instructions (Signed)

## 2024-04-03 ENCOUNTER — Inpatient Hospital Stay

## 2024-04-03 VITALS — BP 155/71 | HR 73 | Temp 98.0°F | Resp 18

## 2024-04-03 DIAGNOSIS — D509 Iron deficiency anemia, unspecified: Secondary | ICD-10-CM | POA: Diagnosis not present

## 2024-04-03 DIAGNOSIS — D500 Iron deficiency anemia secondary to blood loss (chronic): Secondary | ICD-10-CM

## 2024-04-03 MED ORDER — IRON SUCROSE 20 MG/ML IV SOLN
200.0000 mg | Freq: Once | INTRAVENOUS | Status: AC
  Administered 2024-04-03: 200 mg via INTRAVENOUS
  Filled 2024-04-03: qty 10

## 2024-04-03 MED ORDER — SODIUM CHLORIDE 0.9% FLUSH
10.0000 mL | Freq: Once | INTRAVENOUS | Status: AC | PRN
  Administered 2024-04-03: 10 mL

## 2024-04-03 NOTE — Patient Instructions (Signed)

## 2024-04-06 ENCOUNTER — Inpatient Hospital Stay

## 2024-04-06 VITALS — BP 111/61 | HR 96 | Temp 98.0°F | Resp 18

## 2024-04-06 DIAGNOSIS — D500 Iron deficiency anemia secondary to blood loss (chronic): Secondary | ICD-10-CM

## 2024-04-06 DIAGNOSIS — D509 Iron deficiency anemia, unspecified: Secondary | ICD-10-CM | POA: Diagnosis not present

## 2024-04-06 MED ORDER — IRON SUCROSE 20 MG/ML IV SOLN
200.0000 mg | Freq: Once | INTRAVENOUS | Status: AC
  Administered 2024-04-06: 200 mg via INTRAVENOUS
  Filled 2024-04-06: qty 10

## 2024-04-06 MED ORDER — SODIUM CHLORIDE 0.9% FLUSH
10.0000 mL | Freq: Once | INTRAVENOUS | Status: AC | PRN
Start: 2024-04-06 — End: 2024-04-06
  Administered 2024-04-06: 10 mL

## 2024-04-06 NOTE — Patient Instructions (Signed)

## 2024-04-08 ENCOUNTER — Inpatient Hospital Stay

## 2024-04-08 VITALS — BP 147/77 | HR 88 | Temp 97.9°F | Resp 18

## 2024-04-08 DIAGNOSIS — D509 Iron deficiency anemia, unspecified: Secondary | ICD-10-CM | POA: Diagnosis not present

## 2024-04-08 DIAGNOSIS — D500 Iron deficiency anemia secondary to blood loss (chronic): Secondary | ICD-10-CM

## 2024-04-08 MED ORDER — SODIUM CHLORIDE 0.9 % IV SOLN: INTRAVENOUS | Status: DC

## 2024-04-08 MED ORDER — IRON SUCROSE 20 MG/ML IV SOLN
200.0000 mg | Freq: Once | INTRAVENOUS | Status: AC
  Administered 2024-04-08: 200 mg via INTRAVENOUS
  Filled 2024-04-08: qty 10

## 2024-04-08 MED ORDER — SODIUM CHLORIDE 0.9% FLUSH
10.0000 mL | Freq: Once | INTRAVENOUS | Status: AC | PRN
Start: 2024-04-08 — End: 2024-04-08
  Administered 2024-04-08: 10 mL

## 2024-04-08 NOTE — Patient Instructions (Signed)

## 2024-04-10 ENCOUNTER — Inpatient Hospital Stay

## 2024-04-10 VITALS — BP 128/82 | HR 90 | Temp 98.1°F | Resp 18 | Ht 60.63 in | Wt 360.0 lb

## 2024-04-10 DIAGNOSIS — D509 Iron deficiency anemia, unspecified: Secondary | ICD-10-CM | POA: Diagnosis not present

## 2024-04-10 DIAGNOSIS — D500 Iron deficiency anemia secondary to blood loss (chronic): Secondary | ICD-10-CM

## 2024-04-10 MED ORDER — IRON SUCROSE 20 MG/ML IV SOLN
200.0000 mg | Freq: Once | INTRAVENOUS | Status: AC
  Administered 2024-04-10: 200 mg via INTRAVENOUS
  Filled 2024-04-10: qty 10

## 2024-04-10 NOTE — Patient Instructions (Signed)

## 2024-04-28 ENCOUNTER — Ambulatory Visit (HOSPITAL_BASED_OUTPATIENT_CLINIC_OR_DEPARTMENT_OTHER): Admitting: Student

## 2024-05-04 ENCOUNTER — Encounter (HOSPITAL_BASED_OUTPATIENT_CLINIC_OR_DEPARTMENT_OTHER): Payer: Self-pay | Admitting: Student

## 2024-05-04 ENCOUNTER — Other Ambulatory Visit (HOSPITAL_BASED_OUTPATIENT_CLINIC_OR_DEPARTMENT_OTHER): Payer: Self-pay

## 2024-05-04 ENCOUNTER — Ambulatory Visit (HOSPITAL_BASED_OUTPATIENT_CLINIC_OR_DEPARTMENT_OTHER): Admitting: Student

## 2024-05-04 VITALS — BP 128/80 | HR 84 | Temp 98.0°F | Resp 16 | Ht 60.0 in | Wt 355.7 lb

## 2024-05-04 DIAGNOSIS — Z6841 Body Mass Index (BMI) 40.0 and over, adult: Secondary | ICD-10-CM | POA: Diagnosis not present

## 2024-05-04 DIAGNOSIS — J4520 Mild intermittent asthma, uncomplicated: Secondary | ICD-10-CM | POA: Diagnosis not present

## 2024-05-04 DIAGNOSIS — I10 Essential (primary) hypertension: Secondary | ICD-10-CM | POA: Diagnosis not present

## 2024-05-04 DIAGNOSIS — E1169 Type 2 diabetes mellitus with other specified complication: Secondary | ICD-10-CM | POA: Diagnosis not present

## 2024-05-04 DIAGNOSIS — E119 Type 2 diabetes mellitus without complications: Secondary | ICD-10-CM | POA: Diagnosis not present

## 2024-05-04 DIAGNOSIS — E785 Hyperlipidemia, unspecified: Secondary | ICD-10-CM | POA: Diagnosis not present

## 2024-05-04 DIAGNOSIS — D500 Iron deficiency anemia secondary to blood loss (chronic): Secondary | ICD-10-CM

## 2024-05-04 DIAGNOSIS — Z1211 Encounter for screening for malignant neoplasm of colon: Secondary | ICD-10-CM

## 2024-05-04 LAB — POCT GLYCOSYLATED HEMOGLOBIN (HGB A1C)
HbA1c POC (<> result, manual entry): 12.5 % — ABNORMAL HIGH (ref 4.0–5.6)
Hemoglobin A1C: 12.5 % — AB (ref 4.0–5.6)

## 2024-05-04 MED ORDER — OLMESARTAN MEDOXOMIL 5 MG PO TABS
5.0000 mg | ORAL_TABLET | Freq: Every day | ORAL | 3 refills | Status: AC
  Filled 2024-05-04: qty 90, 90d supply, fill #0

## 2024-05-04 NOTE — Progress Notes (Signed)
 Established Patient Office Visit  Subjective   Patient ID: Leah Mendoza, female    DOB: 15-Sep-1976  Age: 48 y.o. MRN: [id number]  Chief Complaint  Patient presents with   Medical Management of Chronic Issues    Here for follow up. Has not seen OBGYN for pap or GI for colonscopy. It was $93 for freestyle libre sensors. Would like to try Dexcom. Metformin is making her stomach really sick. Would like to take something else. Is doing Lantus  solostar 20 units in AM& 20 units in PM. BS this morning was 206. Refill needed on olmesartan , ran out 1 month ago.     HPI  Discussed the use of AI scribe software for clinical note transcription with the patient, who gave verbal consent to proceed.  History of Present Illness   Leah Mendoza is a 48 year old female with type 2 diabetes who presents for diabetes management.  She is experiencing confusion regarding her insulin  regimen, we have discussed multiple times how much insulin  she was taking before and how to increase every 3 days. According to the patient- Initially, she started with 15 units of Lantus  Solostar, then adjusted to 20 units in the morning and 20 at night. Her blood sugar this morning was 206 before eating, typically decreasing to 'like one something' throughout the day, with occasional readings as low as 97. She has not yet obtained a [employer] monitor. Concern with unreliability of reading as she has not yet kept track of readings.  She has been referred to a nutritionist and has an appointment scheduled for Thursday. She has made dietary changes, reducing starches and soda intake while increasing water consumption. She has never seen an endocrinologist for diabetes management.  Her asthma is well-controlled, requiring her Ventolin  inhaler only once in the past week. She has been receiving infusions, with the last one occurring last month, which she states have been beneficial.  She ran out of  olmesartan  a month ago, which she was taking at a dose of 5 mg and did not contact the clinic. Her blood pressure has remained relatively stable despite this.  She has experienced weight fluctuations, with her weight decreasing from 358 pounds in January to 355 pounds currently. She has not yet seen an OBGYN due to financial constraints. She also needs a new referral for a colonoscopy, which was previously canceled due to a scheduling conflict with her infusions.  She is currently taking metformin extended release but reports it causes gastrointestinal discomfort.      Patient Active Problem List   Diagnosis Date Noted   Morbid obesity (HCC) 01/27/2024   Acute left-sided low back pain without sciatica 01/27/2024   Diabetes mellitus without complication (HCC)    Cellulitis at injection site 10/03/2023   Leukocytosis 09/18/2023   Hypertension    Respiratory infection 12/07/2022   Folate deficiency 11/24/2021   Iron  deficiency anemia 08/25/2020   Social History   Tobacco Use   Smoking status: Never    Passive exposure: Never   Smokeless tobacco: Never  Vaping Use   Vaping status: Never Used  Substance Use Topics   Alcohol use: Not Currently   Drug use: Never   Allergies  Allergen Reactions   Latex     UNKNOWN      ROS Per HPI.    Objective:     BP 128/80   Pulse 84   Temp 98 F (36.7 C) (Oral)   Resp 16  Ht 5' (1.524 m)   Wt (!) 355 lb 11.2 oz (161.3 kg)   LMP 04/24/2024 (Exact Date)   SpO2 94%   BMI 69.47 kg/m  BP Readings from Last 3 Encounters:  05/04/24 128/80  04/10/24 128/82  04/08/24 (!) 147/77   Wt Readings from Last 3 Encounters:  05/04/24 (!) 355 lb 11.2 oz (161.3 kg)  04/10/24 (!) 360 lb (163.3 kg)  03/16/24 (!) 365 lb 14.4 oz (166 kg)      Physical Exam Constitutional:      General: She is not in acute distress.    Appearance: Normal appearance. She is obese. She is not ill-appearing.  HENT:     Head: Normocephalic and atraumatic.      Nose: Nose normal.   Eyes:     General: No scleral icterus.    Conjunctiva/sclera: Conjunctivae normal.    Cardiovascular:     Rate and Rhythm: Normal rate and regular rhythm.     Heart sounds: Normal heart sounds. No murmur heard.    No friction rub.  Pulmonary:     Effort: Pulmonary effort is normal. No respiratory distress.     Breath sounds: Normal breath sounds. No wheezing, rhonchi or rales.   Musculoskeletal:        General: Normal range of motion.   Skin:    General: Skin is warm and dry.     Coloration: Skin is not jaundiced or pale.   Neurological:     General: No focal deficit present.     Mental Status: She is alert.   Psychiatric:        Mood and Affect: Mood normal.        Behavior: Behavior normal.      Results for orders placed or performed in visit on 05/04/24  POCT glycosylated hemoglobin (Hb A1C)  Result Value Ref Range   Hemoglobin A1C 12.5 (A) 4.0 - 5.6 %   HbA1c POC (<> result, manual entry) 12.5 (H) 4.0 - 5.6 %   HbA1c, POC (prediabetic range)     HbA1c, POC (controlled diabetic range)      Last CBC Lab Results  Component Value Date   WBC 9.7 03/11/2024   HGB 13.3 03/11/2024   HCT 40.0 03/11/2024   MCV 87.5 03/11/2024   MCH 29.1 03/11/2024   RDW 12.3 03/11/2024   PLT 359 03/11/2024   Last metabolic panel Lab Results  Component Value Date   GLUCOSE 218 (H) 03/11/2024   NA 136 03/11/2024   K 4.2 03/11/2024   CL 97 (L) 03/11/2024   CO2 27 03/11/2024   BUN 13 03/11/2024   CREATININE 0.69 03/11/2024   GFRNONAA >60 03/11/2024   CALCIUM  9.5 03/11/2024   PROT 7.0 03/11/2024   ALBUMIN 3.7 03/11/2024   LABGLOB 3.5 06/28/2021   BILITOT 0.2 03/11/2024   ALKPHOS 91 03/11/2024   AST 15 03/11/2024   ALT 13 03/11/2024   ANIONGAP 12 03/11/2024   Last lipids Lab Results  Component Value Date   CHOL 192 12/16/2023   HDL 37 (L) 12/16/2023   LDLCALC 129 (H) 12/16/2023   TRIG 145 12/16/2023   CHOLHDL 5.2 (H) 12/16/2023   Last  hemoglobin A1c Lab Results  Component Value Date   HGBA1C 12.5 (A) 05/04/2024   HGBA1C 12.5 (H) 05/04/2024      The 10-year ASCVD risk score (Arnett DK, et al., 2019) is: 11.2%    Assessment & Plan:   Assessment and Plan    Type 2 Diabetes  Mellitus Chronic, not at goal. Blood glucose levels remain elevated with fasting glucose at 206 mg/dL and postprandial levels up to 190 mg/dL. She is on Lantus  Solostar, 20 units in the morning and 20 units at night, with confusion regarding dosing. Unable to obtain [facility] for continuous glucose monitoring due to cost. Experiencing gastrointestinal discomfort from metformin. A1c re-evaluation is needed for better glycemic control. Dietary changes include reducing starches and sodas, and increasing water intake. She is an ideal candidate for GLP-1 receptor agonists like Ozempic  or Mounjaro , but cost and insurance coverage are barriers. Referral to endocrinology is planned to expedite diabetes management. - Refer to endocrinology for diabetes management. - Clarify Lantus  dosing schedule: 20 units in the morning and 20 units at night. - Attempt to obtain [facility] for continuous glucose monitoring- provided with coupon code - Advise taking metformin with food to reduce gastrointestinal side effects. - Order A1c and cholesterol panel. - Encourage continued dietary modifications. - Adjust Lantus  dosing: increase by 3 units every 3 days until fasting glucose is 140 mg/dL or less and she should let me know if glucose drops below 70 at any time. - Assess via basic labs icluding cbc, cmp, and a1c  Hypertension Chronic, stable though non compliant due to supply. Ran out of olmesartan  a month ago, but blood pressure remains well-managed. Olmesartan  has renal protective benefits, which is a consideration for continuation. - Re-prescribe olmesartan  5 mg with 90-day supply and refills. - Monitor blood pressure at home and report any  increases.  Asthma Chronic, stable. Asthma is well-controlled, with Ventolin  inhaler used only once in the past week. No current concerns regarding asthma management. - Continue current asthma management with Ventolin  inhaler. - Advise to return if inhaler use increases to more than once per week.  Screening cardiovascular - assess via lipid panel.  General Health Maintenance Overdue for OBGYN visit and colonoscopy. Receiving beneficial infusions. - Provide referral information for OBGYN and encourage scheduling an appointment. - Order referral for colonoscopy. - Continue with regular infusions.  Follow-up Requires follow-up for diabetes management and other health maintenance issues. - Schedule follow-up appointment after endocrinology consultation. - Advise to inform when OBGYN appointment is scheduled. - Advise to report back on [employer] cost and usage.         Return in about 6 weeks (around 06/15/2024) for Chronic Followup.    Kailia Starry T Kaiyla Stahly, PA-C

## 2024-05-04 NOTE — Patient Instructions (Addendum)
 It was nice to see you today!  As we discussed in clinic:  [facility] [place] Address: [address] [place], [place], [place] [address] Phone: [phone]  Increase your nightly insulin  by 3 units every 3 days until your fasting blood sugar in the morning is 140 or less and then send me a message. We need blood sugar readings after each meal and in the morning while fasting.  If you have any problems before your next visit feel free to message me via MyChart (minor issues or questions) or call the office, otherwise you may reach out to schedule an office visit.  Thank you! Dahlila Pfahler, PA-C

## 2024-05-05 LAB — LIPID PANEL
Chol/HDL Ratio: 5.6 ratio — ABNORMAL HIGH (ref 0.0–4.4)
Cholesterol, Total: 200 mg/dL — ABNORMAL HIGH (ref 100–199)
HDL: 36 mg/dL — ABNORMAL LOW
LDL Chol Calc (NIH): 111 mg/dL — ABNORMAL HIGH (ref 0–99)
Triglycerides: 304 mg/dL — ABNORMAL HIGH (ref 0–149)
VLDL Cholesterol Cal: 53 mg/dL — ABNORMAL HIGH (ref 5–40)

## 2024-05-05 LAB — HEMOGLOBIN A1C
Est. average glucose Bld gHb Est-mCnc: 318 mg/dL
Hgb A1c MFr Bld: 12.7 % — ABNORMAL HIGH (ref 4.8–5.6)

## 2024-05-07 ENCOUNTER — Ambulatory Visit (HOSPITAL_BASED_OUTPATIENT_CLINIC_OR_DEPARTMENT_OTHER): Payer: Self-pay | Admitting: Student

## 2024-05-07 ENCOUNTER — Other Ambulatory Visit (HOSPITAL_BASED_OUTPATIENT_CLINIC_OR_DEPARTMENT_OTHER): Payer: Self-pay | Admitting: Student

## 2024-05-07 DIAGNOSIS — E1169 Type 2 diabetes mellitus with other specified complication: Secondary | ICD-10-CM

## 2024-05-07 MED ORDER — ATORVASTATIN CALCIUM 40 MG PO TABS: 40.0000 mg | ORAL_TABLET | Freq: Every day | ORAL | 1 refills | Status: DC

## 2024-06-11 DIAGNOSIS — E119 Type 2 diabetes mellitus without complications: Secondary | ICD-10-CM | POA: Diagnosis not present

## 2024-06-11 DIAGNOSIS — Z6841 Body Mass Index (BMI) 40.0 and over, adult: Secondary | ICD-10-CM | POA: Diagnosis not present

## 2024-06-15 ENCOUNTER — Encounter (HOSPITAL_BASED_OUTPATIENT_CLINIC_OR_DEPARTMENT_OTHER): Payer: Self-pay

## 2024-06-15 ENCOUNTER — Ambulatory Visit (HOSPITAL_BASED_OUTPATIENT_CLINIC_OR_DEPARTMENT_OTHER): Admitting: Student

## 2024-07-09 ENCOUNTER — Encounter: Payer: Self-pay | Admitting: Gastroenterology

## 2024-07-27 ENCOUNTER — Encounter (HOSPITAL_BASED_OUTPATIENT_CLINIC_OR_DEPARTMENT_OTHER): Payer: Self-pay

## 2024-08-03 ENCOUNTER — Encounter (HOSPITAL_BASED_OUTPATIENT_CLINIC_OR_DEPARTMENT_OTHER): Payer: Self-pay | Admitting: Student

## 2024-08-03 ENCOUNTER — Ambulatory Visit (INDEPENDENT_AMBULATORY_CARE_PROVIDER_SITE_OTHER): Admitting: Student

## 2024-08-03 ENCOUNTER — Other Ambulatory Visit (HOSPITAL_BASED_OUTPATIENT_CLINIC_OR_DEPARTMENT_OTHER): Payer: Self-pay

## 2024-08-03 VITALS — BP 125/78 | HR 98 | Temp 98.3°F | Resp 16 | Ht 60.0 in | Wt 329.8 lb

## 2024-08-03 DIAGNOSIS — B3731 Acute candidiasis of vulva and vagina: Secondary | ICD-10-CM | POA: Diagnosis not present

## 2024-08-03 DIAGNOSIS — E785 Hyperlipidemia, unspecified: Secondary | ICD-10-CM

## 2024-08-03 DIAGNOSIS — Z794 Long term (current) use of insulin: Secondary | ICD-10-CM | POA: Diagnosis not present

## 2024-08-03 DIAGNOSIS — E1165 Type 2 diabetes mellitus with hyperglycemia: Secondary | ICD-10-CM | POA: Diagnosis not present

## 2024-08-03 DIAGNOSIS — R011 Cardiac murmur, unspecified: Secondary | ICD-10-CM | POA: Diagnosis not present

## 2024-08-03 DIAGNOSIS — E1169 Type 2 diabetes mellitus with other specified complication: Secondary | ICD-10-CM | POA: Diagnosis not present

## 2024-08-03 DIAGNOSIS — Z23 Encounter for immunization: Secondary | ICD-10-CM | POA: Diagnosis not present

## 2024-08-03 DIAGNOSIS — Z6841 Body Mass Index (BMI) 40.0 and over, adult: Secondary | ICD-10-CM

## 2024-08-03 LAB — POCT GLYCOSYLATED HEMOGLOBIN (HGB A1C)
HbA1c POC (<> result, manual entry): 13.1 % (ref 4.0–5.6)
Hemoglobin A1C: 13.1 % — AB (ref 4.0–5.6)

## 2024-08-03 MED ORDER — FLUCONAZOLE 150 MG PO TABS
150.0000 mg | ORAL_TABLET | Freq: Once | ORAL | 0 refills | Status: AC
  Filled 2024-08-03: qty 2, 3d supply, fill #0

## 2024-08-03 MED ORDER — METFORMIN HCL ER 500 MG PO TB24
1000.0000 mg | ORAL_TABLET | Freq: Two times a day (BID) | ORAL | 6 refills | Status: AC
  Filled 2024-08-03: qty 120, 30d supply, fill #0

## 2024-08-03 MED ORDER — TIRZEPATIDE 2.5 MG/0.5ML ~~LOC~~ SOAJ
2.5000 mg | SUBCUTANEOUS | 0 refills | Status: DC
  Filled 2024-08-03: qty 2, 28d supply, fill #0

## 2024-08-03 MED ORDER — LANTUS SOLOSTAR 100 UNIT/ML ~~LOC~~ SOPN
25.0000 [IU] | PEN_INJECTOR | Freq: Every day | SUBCUTANEOUS | 3 refills | Status: AC
  Filled 2024-08-03: qty 15, 60d supply, fill #0

## 2024-08-03 NOTE — Progress Notes (Signed)
 Established Patient Office Visit  Subjective   Patient ID: Leah Mendoza, female    DOB: 1976/07/23  Age: 48 y.o. MRN: [id number]  Chief Complaint  Patient presents with   Medical Management of Chronic Issues    follow up    Vaginitis    Has had a yeast infection for 1 month. Tried home remedies- tried azo.    HPI  Discussed the use of AI scribe software for clinical note transcription with the patient, who gave verbal consent to proceed.  History of Present Illness   Leah Mendoza is a 48 year old female with extremely poorly controlled type 2 diabetes who presents with a persistent yeast infection and requests medication refills.  She has been experiencing a yeast infection for over a month, primarily characterized by itching. Over-the-counter treatments, including Monistat, have been ineffective in providing relief.  She manages her type 2 diabetes with insulin  and metformin . There have been issues with affording medications outside of this and she has not heard from endocrinology yet. She has lost significant weight, from 355 to 329 pounds, and reports that she has been making dietary changes after seeing a nutritionist. She reports that her blood sugar was in the 300s mg/dL this morning before eating. She currently takes 22 units of insulin  at night and one tablet of metformin  in the morning and one at night. She has not experienced any issues with metformin  when taken after meals. We have tried titrating upwards on insulin  on multiple occasions but it seems there is some sort of communication issue so we will move forward as best we can. She is not currently taking Jardiance  due to cost. She has been unable to afford Freestyle Libre sensors but has qualified for a program to receive them for free, pending a signature.    Currently taking lipitor without any noted issues.  She has not yet met with an OBGYN for a Pap smear, which is due, and has an  upcoming consultation for a colonoscopy. She has not had an eye exam this year and is due for a pneumonia vaccine, although she received a flu vaccine recently.      Patient Active Problem List   Diagnosis Date Noted   Morbid obesity (HCC) 01/27/2024   Acute left-sided low back pain without sciatica 01/27/2024   Diabetes mellitus without complication (HCC)    Cellulitis at injection site 10/03/2023   Leukocytosis 09/18/2023   Hypertension    Respiratory infection 12/07/2022   Folate deficiency 11/24/2021   Iron  deficiency anemia 08/25/2020   Past Medical History:  Diagnosis Date   Asthma    Diabetes mellitus without complication (HCC)    Folate deficiency 11/24/2021   Hypertension    Iron  deficiency anemia 08/25/2020   Iron  deficiency anemia secondary to blood loss (chronic)    Social History   Tobacco Use   Smoking status: Never    Passive exposure: Never   Smokeless tobacco: Never  Vaping Use   Vaping status: Never Used  Substance Use Topics   Alcohol use: Not Currently   Drug use: Never   Allergies  Allergen Reactions   Latex     UNKNOWN      ROS Per HPI.    Objective:     BP 125/78   Pulse 98   Temp 98.3 F (36.8 C) (Oral)   Resp 16   Ht 5' (1.524 m)   Wt (!) 329 lb 12.8 oz (149.6 kg)  LMP 07/25/2024 (Approximate)   SpO2 92%   BMI 64.41 kg/m  BP Readings from Last 3 Encounters:  08/03/24 125/78  05/04/24 128/80  04/10/24 128/82   Wt Readings from Last 3 Encounters:  08/03/24 (!) 329 lb 12.8 oz (149.6 kg)  05/04/24 (!) 355 lb 11.2 oz (161.3 kg)  04/10/24 (!) 360 lb (163.3 kg)      Physical Exam Constitutional:      General: She is not in acute distress.    Appearance: Normal appearance. She is obese. She is not ill-appearing.  HENT:     Head: Normocephalic and atraumatic.     Nose: Nose normal.  Eyes:     General: No scleral icterus.    Conjunctiva/sclera: Conjunctivae normal.  Neck:     Vascular: No carotid bruit.   Cardiovascular:     Rate and Rhythm: Normal rate and regular rhythm.     Heart sounds: Murmur (2/6 systolic murmur best heard in aortic region- complicated auscultation due to habitus) heard.     No friction rub.  Pulmonary:     Effort: Pulmonary effort is normal. No respiratory distress.     Breath sounds: Normal breath sounds. No wheezing, rhonchi or rales.  Musculoskeletal:        General: Normal range of motion.     Right foot: No deformity or Charcot foot.     Left foot: No deformity or Charcot foot.  Feet:     Right foot:     Protective Sensation: 9 sites tested.  9 sites sensed.     Skin integrity: Dry skin present. No ulcer, skin breakdown, erythema or warmth.     Toenail Condition: Right toenails are abnormally thick.     Left foot:     Protective Sensation: 9 sites tested.  9 sites sensed.     Skin integrity: Dry skin present. No ulcer, skin breakdown, erythema or warmth.     Toenail Condition: Left toenails are abnormally thick. Fungal disease present.    Comments: R side DP pulse is faint. Skin:    General: Skin is warm and dry.     Coloration: Skin is not jaundiced or pale.  Neurological:     General: No focal deficit present.     Mental Status: She is alert.  Psychiatric:        Mood and Affect: Mood normal.        Behavior: Behavior normal.      Results for orders placed or performed in visit on 08/03/24  POCT glycosylated hemoglobin (Hb A1C)  Result Value Ref Range   Hemoglobin A1C 13.1 (A) 4.0 - 5.6 %   HbA1c POC (<> result, manual entry) 13.1 4.0 - 5.6 %   HbA1c, POC (prediabetic range)     HbA1c, POC (controlled diabetic range)      Last CBC Lab Results  Component Value Date   WBC 9.7 03/11/2024   HGB 13.3 03/11/2024   HCT 40.0 03/11/2024   MCV 87.5 03/11/2024   MCH 29.1 03/11/2024   RDW 12.3 03/11/2024   PLT 359 03/11/2024   Last metabolic panel Lab Results  Component Value Date   GLUCOSE 218 (H) 03/11/2024   NA 136 03/11/2024   K 4.2  03/11/2024   CL 97 (L) 03/11/2024   CO2 27 03/11/2024   BUN 13 03/11/2024   CREATININE 0.69 03/11/2024   GFRNONAA >60 03/11/2024   CALCIUM  9.5 03/11/2024   PROT 7.0 03/11/2024   ALBUMIN 3.7 03/11/2024   LABGLOB  3.5 06/28/2021   BILITOT 0.2 03/11/2024   ALKPHOS 91 03/11/2024   AST 15 03/11/2024   ALT 13 03/11/2024   ANIONGAP 12 03/11/2024   Last lipids Lab Results  Component Value Date   CHOL 200 (H) 05/04/2024   HDL 36 (L) 05/04/2024   LDLCALC 111 (H) 05/04/2024   TRIG 304 (H) 05/04/2024   CHOLHDL 5.6 (H) 05/04/2024   Last hemoglobin A1c Lab Results  Component Value Date   HGBA1C 13.1 (A) 08/03/2024   HGBA1C 13.1 08/03/2024      The 10-year ASCVD risk score (Arnett DK, et al., 2019) is: 11.5%    Assessment & Plan:   Assessment and Plan    Candidal vulvovaginitis Chronic yeast infection persisting for over a month with itching as the primary symptom. Over-the-counter treatments, including Monistat, have been ineffective. - Prescribe fluconazole  150 mg, dispense 2 tablets. Instruct to take one tablet now and a second tablet in two days if symptoms persist. - Send prescription to [facility].  Type 2 diabetes mellitus with obesity, hyperglycemia, and hyperlipidemia Chronic, not at goal- very poorly controlled. Type 2 diabetes with recent weight loss from 355 lbs to 329 lbs. Blood sugar was 398 mg/dL this morning, indicating poor glycemic control. Current insulin  dose is 22 units nightly, and metformin  is 500 mg twice daily. Lipitor is being taken for hyperlipidemia. Freestyle Herlene is unaffordable, but she may qualify for a free device. Mounjaro  (GLP-1 agonist) is being considered for better glycemic control and weight loss. - Assess labs today - Increase insulin  to 25 units nightly. - Increase metformin  to 1000 mg in the morning and 500 mg at night. - Assist with obtaining Freestyle Libre for continuous glucose monitoring. - Attempt to obtain Mounjaro  with a  copay card for cost reduction, start at 2.5 mg if covered. Cost was an issue in the past. - Order point of care A1c test. - Refer to endocrinology for further diabetes management- gave number to call today. - Continue Lipitor for hyperlipidemia. - Schedule monthly follow-ups if started on Mounjaro .  Heart murmur, unspecified Minor heart murmur detected during auscultation. No previous echocardiogram on record. - Order echocardiogram to evaluate heart murmur.  General Health Maintenance Due for Pap smear and eye exam. Pneumonia vaccine is due, but flu vaccine has been administered. - Refer to OBGYN for Pap smear. - Schedule eye exam to check for diabetic retinopathy. - Administer pneumonia vaccine at next visit.      Return in about 4 weeks (around 08/31/2024) for DM.    Onnika Siebel T Darnise Montag, PA-C

## 2024-08-03 NOTE — Patient Instructions (Addendum)
 It was nice to see you today!  As we discussed in clinic:  Please call endocrinology:[facility] Address: [address] [place], [place], [place] [address] Phone: [phone]  Please take metformin  two in the morning and one at night for one week and then 2 in the morning and 2 at night thereafter.  Use the copay card for the mounjaro .  Increase your lantus  to 25 units nightly.  Set up for an diabetes eye exam.  I thought I heard a heart murmur today, so we will plan to get an echocardiogram (pictures of your heart). The cardiology office in [place] should call to get this scheduled.  If you have any problems before your next visit feel free to message me via MyChart (minor issues or questions) or call the office, otherwise you may reach out to schedule an office visit.  Thank you! Breyon Sigg, PA-C

## 2024-08-04 ENCOUNTER — Ambulatory Visit (HOSPITAL_BASED_OUTPATIENT_CLINIC_OR_DEPARTMENT_OTHER): Payer: Self-pay

## 2024-08-04 ENCOUNTER — Other Ambulatory Visit (HOSPITAL_BASED_OUTPATIENT_CLINIC_OR_DEPARTMENT_OTHER): Payer: Self-pay

## 2024-08-04 DIAGNOSIS — E785 Hyperlipidemia, unspecified: Secondary | ICD-10-CM

## 2024-08-04 DIAGNOSIS — N179 Acute kidney failure, unspecified: Secondary | ICD-10-CM

## 2024-08-04 LAB — MICROALBUMIN / CREATININE URINE RATIO
Creatinine, Urine: 62.6 mg/dL
Microalb/Creat Ratio: 67 mg/g{creat} — ABNORMAL HIGH (ref 0–29)
Microalbumin, Urine: 41.8 ug/mL

## 2024-08-04 LAB — COMPREHENSIVE METABOLIC PANEL WITH GFR
ALT: 15 IU/L (ref 0–32)
AST: 10 IU/L (ref 0–40)
Albumin: 4 g/dL (ref 3.9–4.9)
Alkaline Phosphatase: 113 IU/L (ref 41–116)
BUN/Creatinine Ratio: 16 (ref 9–23)
BUN: 16 mg/dL (ref 6–24)
Bilirubin Total: 0.3 mg/dL (ref 0.0–1.2)
CO2: 22 mmol/L (ref 20–29)
Calcium: 9.7 mg/dL (ref 8.7–10.2)
Chloride: 97 mmol/L (ref 96–106)
Creatinine, Ser: 1.03 mg/dL — ABNORMAL HIGH (ref 0.57–1.00)
Globulin, Total: 2.8 g/dL (ref 1.5–4.5)
Glucose: 417 mg/dL — ABNORMAL HIGH (ref 70–99)
Potassium: 4.6 mmol/L (ref 3.5–5.2)
Sodium: 135 mmol/L (ref 134–144)
Total Protein: 6.8 g/dL (ref 6.0–8.5)
eGFR: 67 mL/min/1.73 (ref >59)

## 2024-08-04 LAB — CBC WITH DIFFERENTIAL/PLATELET
Basophils Absolute: 0.1 x10E3/uL (ref 0.0–0.2)
Basos: 1 %
EOS (ABSOLUTE): 0.1 x10E3/uL (ref 0.0–0.4)
Eos: 2 %
Hematocrit: 46.4 % (ref 34.0–46.6)
Hemoglobin: 14.8 g/dL (ref 11.1–15.9)
Immature Grans (Abs): 0 x10E3/uL (ref 0.0–0.1)
Immature Granulocytes: 0 %
Lymphocytes Absolute: 1.5 x10E3/uL (ref 0.7–3.1)
Lymphs: 18 %
MCH: 29.6 pg (ref 26.6–33.0)
MCHC: 31.9 g/dL (ref 31.5–35.7)
MCV: 93 fL (ref 79–97)
Monocytes Absolute: 0.6 x10E3/uL (ref 0.1–0.9)
Monocytes: 7 %
Neutrophils Absolute: 6.1 x10E3/uL (ref 1.4–7.0)
Neutrophils: 72 %
Platelets: 441 x10E3/uL (ref 150–450)
RBC: 5 x10E6/uL (ref 3.77–5.28)
RDW: 12.1 % (ref 11.7–15.4)
WBC: 8.4 x10E3/uL (ref 3.4–10.8)

## 2024-08-04 LAB — LIPID PANEL
Chol/HDL Ratio: 5.6 ratio — ABNORMAL HIGH (ref 0.0–4.4)
Cholesterol, Total: 209 mg/dL — ABNORMAL HIGH (ref 100–199)
HDL: 37 mg/dL — ABNORMAL LOW (ref >39)
LDL Chol Calc (NIH): 141 mg/dL — ABNORMAL HIGH (ref 0–99)
Triglycerides: 169 mg/dL — ABNORMAL HIGH (ref 0–149)
VLDL Cholesterol Cal: 31 mg/dL (ref 5–40)

## 2024-08-05 ENCOUNTER — Encounter: Payer: Self-pay | Admitting: Hematology and Oncology

## 2024-08-05 ENCOUNTER — Other Ambulatory Visit (HOSPITAL_BASED_OUTPATIENT_CLINIC_OR_DEPARTMENT_OTHER): Payer: Self-pay

## 2024-08-06 ENCOUNTER — Ambulatory Visit: Attending: Student

## 2024-08-06 ENCOUNTER — Other Ambulatory Visit (HOSPITAL_BASED_OUTPATIENT_CLINIC_OR_DEPARTMENT_OTHER): Payer: Self-pay

## 2024-08-06 DIAGNOSIS — R011 Cardiac murmur, unspecified: Secondary | ICD-10-CM

## 2024-08-06 LAB — ECHOCARDIOGRAM COMPLETE
AR max vel: 1.87 cm2
AV Area VTI: 2 cm2
AV Area mean vel: 1.76 cm2
AV Mean grad: 5 mmHg
AV Peak grad: 8.9 mmHg
Ao pk vel: 1.5 m/s
Area-P 1/2: 3.76 cm2
MV VTI: 1.24 cm2
S' Lateral: 3.3 cm

## 2024-08-07 ENCOUNTER — Other Ambulatory Visit (HOSPITAL_BASED_OUTPATIENT_CLINIC_OR_DEPARTMENT_OTHER): Payer: Self-pay

## 2024-08-07 ENCOUNTER — Encounter (HOSPITAL_BASED_OUTPATIENT_CLINIC_OR_DEPARTMENT_OTHER): Payer: Self-pay

## 2024-08-07 DIAGNOSIS — E1165 Type 2 diabetes mellitus with hyperglycemia: Secondary | ICD-10-CM | POA: Diagnosis not present

## 2024-08-07 DIAGNOSIS — N179 Acute kidney failure, unspecified: Secondary | ICD-10-CM | POA: Insufficient documentation

## 2024-08-09 ENCOUNTER — Encounter (HOSPITAL_BASED_OUTPATIENT_CLINIC_OR_DEPARTMENT_OTHER): Payer: Self-pay | Admitting: Student

## 2024-08-10 ENCOUNTER — Other Ambulatory Visit (HOSPITAL_BASED_OUTPATIENT_CLINIC_OR_DEPARTMENT_OTHER): Payer: Self-pay | Admitting: Student

## 2024-08-10 ENCOUNTER — Other Ambulatory Visit (HOSPITAL_BASED_OUTPATIENT_CLINIC_OR_DEPARTMENT_OTHER): Payer: Self-pay

## 2024-08-10 DIAGNOSIS — E1169 Type 2 diabetes mellitus with other specified complication: Secondary | ICD-10-CM

## 2024-08-10 MED ORDER — ATORVASTATIN CALCIUM 40 MG PO TABS: 60.0000 mg | ORAL_TABLET | Freq: Every day | ORAL | 1 refills | Status: AC

## 2024-08-12 ENCOUNTER — Encounter (HOSPITAL_BASED_OUTPATIENT_CLINIC_OR_DEPARTMENT_OTHER): Payer: Self-pay

## 2024-08-12 ENCOUNTER — Ambulatory Visit (HOSPITAL_BASED_OUTPATIENT_CLINIC_OR_DEPARTMENT_OTHER)

## 2024-08-12 ENCOUNTER — Ambulatory Visit (HOSPITAL_BASED_OUTPATIENT_CLINIC_OR_DEPARTMENT_OTHER): Admitting: Student

## 2024-08-12 DIAGNOSIS — N179 Acute kidney failure, unspecified: Secondary | ICD-10-CM | POA: Diagnosis not present

## 2024-08-12 DIAGNOSIS — D500 Iron deficiency anemia secondary to blood loss (chronic): Secondary | ICD-10-CM

## 2024-08-12 LAB — BASIC METABOLIC PANEL WITH GFR
BUN/Creatinine Ratio: 12 (ref 9–23)
BUN: 9 mg/dL (ref 6–24)
CO2: 23 mmol/L (ref 20–29)
Calcium: 9.2 mg/dL (ref 8.7–10.2)
Chloride: 98 mmol/L (ref 96–106)
Creatinine, Ser: 0.73 mg/dL (ref 0.57–1.00)
Glucose: 291 mg/dL — ABNORMAL HIGH (ref 70–99)
Potassium: 4.4 mmol/L (ref 3.5–5.2)
Sodium: 134 mmol/L (ref 134–144)
eGFR: 102 mL/min/1.73 (ref >59)

## 2024-08-13 ENCOUNTER — Ambulatory Visit (HOSPITAL_BASED_OUTPATIENT_CLINIC_OR_DEPARTMENT_OTHER): Payer: Self-pay | Admitting: Student

## 2024-08-18 ENCOUNTER — Encounter (HOSPITAL_BASED_OUTPATIENT_CLINIC_OR_DEPARTMENT_OTHER): Payer: Self-pay

## 2024-08-31 ENCOUNTER — Encounter: Payer: Self-pay | Admitting: Hematology and Oncology

## 2024-08-31 ENCOUNTER — Encounter (HOSPITAL_BASED_OUTPATIENT_CLINIC_OR_DEPARTMENT_OTHER): Payer: Self-pay | Admitting: Student

## 2024-08-31 ENCOUNTER — Telehealth: Payer: Self-pay

## 2024-08-31 ENCOUNTER — Ambulatory Visit (INDEPENDENT_AMBULATORY_CARE_PROVIDER_SITE_OTHER): Admitting: Student

## 2024-08-31 ENCOUNTER — Other Ambulatory Visit (HOSPITAL_BASED_OUTPATIENT_CLINIC_OR_DEPARTMENT_OTHER): Payer: Self-pay

## 2024-08-31 VITALS — BP 117/68 | HR 92 | Temp 98.2°F | Resp 16 | Ht 60.0 in | Wt 331.0 lb

## 2024-08-31 DIAGNOSIS — E1165 Type 2 diabetes mellitus with hyperglycemia: Secondary | ICD-10-CM

## 2024-08-31 DIAGNOSIS — E66813 Obesity, class 3: Secondary | ICD-10-CM | POA: Diagnosis not present

## 2024-08-31 DIAGNOSIS — I10 Essential (primary) hypertension: Secondary | ICD-10-CM

## 2024-08-31 DIAGNOSIS — E1169 Type 2 diabetes mellitus with other specified complication: Secondary | ICD-10-CM | POA: Insufficient documentation

## 2024-08-31 DIAGNOSIS — Z6841 Body Mass Index (BMI) 40.0 and over, adult: Secondary | ICD-10-CM

## 2024-08-31 DIAGNOSIS — M5442 Lumbago with sciatica, left side: Secondary | ICD-10-CM

## 2024-08-31 DIAGNOSIS — E785 Hyperlipidemia, unspecified: Secondary | ICD-10-CM

## 2024-08-31 DIAGNOSIS — Z794 Long term (current) use of insulin: Secondary | ICD-10-CM

## 2024-08-31 MED ORDER — CYCLOBENZAPRINE HCL 10 MG PO TABS
10.0000 mg | ORAL_TABLET | Freq: Three times a day (TID) | ORAL | 0 refills | Status: AC | PRN
  Filled 2024-08-31: qty 30, 10d supply, fill #0

## 2024-08-31 MED ORDER — EMPAGLIFLOZIN 10 MG PO TABS
10.0000 mg | ORAL_TABLET | Freq: Every day | ORAL | 0 refills | Status: DC
  Filled 2024-08-31: qty 30, 30d supply, fill #0

## 2024-08-31 NOTE — Progress Notes (Signed)
 Complex Care Management Note  Care Guide Note 08/31/2024 Name: Leah Mendoza MRN: [id number] DOB: 03/21/1976  Leah Mendoza is a 48 y.o. year old female who sees Rothfuss, Jacob T, PA-C for primary care. I reached out to Leah Mendoza by phone today to offer complex care management services.  [name] was given information about Complex Care Management services today including:   The Complex Care Management services include support from the care team which includes your Nurse Care Manager, Clinical Social Worker, or Pharmacist.  The Complex Care Management team is here to help remove barriers to the health concerns and goals most important to you. Complex Care Management services are voluntary, and the patient may decline or stop services at any time by request to their care team member.   Complex Care Management Consent Status: Patient agreed to services and verbal consent obtained.   Follow up plan:  Telephone appointment with complex care management team member scheduled for:  09/07/24 at 9:00 a.m.   Encounter Outcome:  Patient Scheduled  Dreama [facility]  [facility], [facility] VBCI Assistant Direct Dial: [phone]  Fax: [phone]

## 2024-08-31 NOTE — Patient Instructions (Addendum)
 It was nice to see you today!  As we discussed in clinic:  Increase your insulin  to 35 units daily.  Make sure you make an appointment to get your eyes checked.  Please make sure to follow up with the OBGYN to get your pap smear done.  If you have any problems before your next visit feel free to message me via MyChart (minor issues or questions) or call the office, otherwise you may reach out to schedule an office visit.  Thank you! Saarah Dewing, PA-C

## 2024-08-31 NOTE — Progress Notes (Signed)
 Established Patient Office Visit  Subjective   Patient ID: Leah Mendoza, female    DOB: 1976/08/08  Age: 48 y.o. MRN: [id number]  Chief Complaint  Patient presents with   Medical Management of Chronic Issues    Follow up. Does not recall ever starting Jardiance . Mounjaro  was $90 with copay. Has not picked it up. Has appt for GI-colonoscopy consult tomorrow.   Hip Pain    Having left sided hip pain for 3 weeks that radiates down leg. Taking meloxicam .    Discussed the use of AI scribe software for clinical note transcription with the patient, who gave verbal consent to proceed.  History of Present Illness   Leah Mendoza is a 48 year old female with diabetes who presents for follow-up of her diabetes management.  Her last A1c was 13.1. She is scheduled to see endocrinology on December 26th. She uses a glucose monitor and her blood sugar levels remain high throughout the day, often exceeding 400 mg/dL, with the lowest readings still above 200 mg/dL. She takes Lantus  insulin  at 30 units nightly. She is also on metformin  2000 mg daily without gastrointestinal issues when taken with food. She has not started Jardiance , although it was prescribed, will resend. She is trying to reduce carbohydrate intake, including bread, rice, pasta, and potatoes- encouraged to continue.  She reports left-sided hip pain for the past three weeks, starting in the hip/low back and radiating down the leg, particularly noticeable in the morning and after sitting for extended periods. 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 She takes meloxicam  for the pain, which provides minimal relief, and Flexeril , which helps slightly. No numbness or tingling in the groin, and no new incontinence. No red flags noted.  She has lost 20 pounds since June, previously weighing 355 pounds. She is considering weight loss surgery as a potential aid in managing her diabetes.    She has not yet seen an eye doctor or  OBGYN this year but has a GI appointment scheduled for a colonoscopy.  No muscle aches or joint pains from Lipitor, and occasional diarrhea with meloxicam  use.      Patient Active Problem List   Diagnosis Date Noted   AKI (acute kidney injury) 08/07/2024   Morbid obesity (HCC) 01/27/2024   Acute left-sided low back pain without sciatica 01/27/2024   Diabetes mellitus without complication (HCC)    Cellulitis at injection site 10/03/2023   Leukocytosis 09/18/2023   Hypertension    Respiratory infection 12/07/2022   Folate deficiency 11/24/2021   Iron  deficiency anemia 08/25/2020   Past Medical History:  Diagnosis Date   Asthma    Diabetes mellitus without complication (HCC)    Folate deficiency 11/24/2021   Hypertension    Iron  deficiency anemia 08/25/2020   Iron  deficiency anemia secondary to blood loss (chronic)    Social History   Tobacco Use   Smoking status: Never    Passive exposure: Never   Smokeless tobacco: Never  Vaping Use   Vaping status: Never Used  Substance Use Topics   Alcohol use: Not Currently   Drug use: Never   Allergies  Allergen Reactions   Latex     UNKNOWN      ROS Per HPI.    Objective:     BP 117/68   Pulse 92   Temp 98.2 F (36.8 C) (Oral)   Resp 16   Ht 5' (1.524 m)   Wt (!) 331 lb (150.1 kg)   LMP  08/24/2024 (Approximate)   SpO2 97%   BMI 64.64 kg/m  BP Readings from Last 3 Encounters:  08/31/24 117/68  08/03/24 125/78  05/04/24 128/80   Wt Readings from Last 3 Encounters:  08/31/24 (!) 331 lb (150.1 kg)  08/03/24 (!) 329 lb 12.8 oz (149.6 kg)  05/04/24 (!) 355 lb 11.2 oz (161.3 kg)      Physical Exam Constitutional:      General: She is not in acute distress.    Appearance: Normal appearance. She is not ill-appearing.  HENT:     Head: Normocephalic and atraumatic.     Nose: Nose normal.  Eyes:     General: No scleral icterus.    Conjunctiva/sclera: Conjunctivae normal.  Cardiovascular:     Rate and  Rhythm: Normal rate and regular rhythm.     Heart sounds: Normal heart sounds. No murmur heard.    No friction rub.  Pulmonary:     Effort: Pulmonary effort is normal. No respiratory distress.     Breath sounds: Normal breath sounds. No wheezing, rhonchi or rales.  Musculoskeletal:        General: Normal range of motion.     Comments: Slight tenderness to palpation of left sided paraspinous muscles. No midline tenderness, bony step offs, palpable crepitus.  Skin:    General: Skin is warm and dry.     Coloration: Skin is not jaundiced or pale.  Neurological:     General: No focal deficit present.     Mental Status: She is alert.  Psychiatric:        Mood and Affect: Mood normal.        Behavior: Behavior normal.      No results found for any visits on 08/31/24.  Last CBC Lab Results  Component Value Date   WBC 8.4 08/03/2024   HGB 14.8 08/03/2024   HCT 46.4 08/03/2024   MCV 93 08/03/2024   MCH 29.6 08/03/2024   RDW 12.1 08/03/2024   PLT 441 08/03/2024   Last metabolic panel Lab Results  Component Value Date   GLUCOSE 291 (H) 08/12/2024   NA 134 08/12/2024   K 4.4 08/12/2024   CL 98 08/12/2024   CO2 23 08/12/2024   BUN 9 08/12/2024   CREATININE 0.73 08/12/2024   EGFR 102 08/12/2024   CALCIUM  9.2 08/12/2024   PROT 6.8 08/03/2024   ALBUMIN 4.0 08/03/2024   LABGLOB 2.8 08/03/2024   BILITOT 0.3 08/03/2024   ALKPHOS 113 08/03/2024   AST 10 08/03/2024   ALT 15 08/03/2024   ANIONGAP 12 03/11/2024   Last lipids Lab Results  Component Value Date   CHOL 209 (H) 08/03/2024   HDL 37 (L) 08/03/2024   LDLCALC 141 (H) 08/03/2024   TRIG 169 (H) 08/03/2024   CHOLHDL 5.6 (H) 08/03/2024   Last hemoglobin A1c Lab Results  Component Value Date   HGBA1C 13.1 (A) 08/03/2024   HGBA1C 13.1 08/03/2024      The 10-year ASCVD risk score (Arnett DK, et al., 2019) is: 8.6%    Assessment & Plan:   Assessment and Plan    Type 2 diabetes mellitus with poor glycemic  control Chronic, very poorly controled and has been non compliant as well as cost prohibitive for many medications at this time. Type 2 diabetes mellitus with significantly elevated A1c of 13.1%. Blood glucose levels often exceed 400 mg/dL, with the lowest readings above 200 mg/dL. Current regimen includes Lantus  30 units nightly, metformin  2000 mg daily, and plans to  initiate Jardiance . Cost of medications impacts adherence. Weight loss of 20 pounds since June noted, but further reduction is necessary. Discussed potential bariatric surgery as a weight loss strategy, which may aid in diabetes management. Insulin  titration is ongoing. - Increase Lantus  to 35 units nightly - Reorder Jardiance , start at 10 mg and increase to 25 mg in one month - Continue metformin  2000 mg daily - Contact pharmacy for disease state management and adherence assistance. - Endocrinology initial appointment on December 26th - Discuss potential bariatric surgery for weight loss and diabetes management  Obesity, class 3 Class 3 obesity with a weight of 355 pounds in June, now reduced by 20 pounds. Weight loss is crucial for improving diabetes management and reducing comorbidities. Discussed potential for bariatric surgery as a weight loss strategy. Emphasized the need for dietary changes and exercise post-surgery to maintain weight loss. - Refer to general surgery for bariatric surgery consultation - Continue weight loss efforts with dietary modifications and exercise  Left hip and lower back pain with sciatica Left hip and lower back pain with sciatica, present for three weeks. Pain is exacerbated by prolonged sitting or lying down, suggesting a pinched nerve. Current treatment with meloxicam  and Flexeril  provides minimal relief. No signs of neurological deficits such as numbness or incontinence. Weight loss may help reduce nerve compression. - Continue Flexeril , increase dose to 10 mg, up to three times daily as needed -  Monitor symptoms for three weeks, consider physical therapy if no improvement - Encourage weight loss to reduce nerve compression  Hypertension Chronic, stable. Hypertension is well-controlled with current medication regimen. - Continue current regimen  Hyperlipidemia Chronic, stable. Hyperlipidemia managed with Lipitor. No reported muscle aches or joint pains associated with medication. - Continue current regimen.  General Health Maintenance Routine health maintenance is due. No eye exam or OBGYN visit this year. - Schedule eye exam - Schedule OBGYN appointment      Return in about 6 weeks (around 10/12/2024) for DM.    Atreyu Mak T Zayley Arras, PA-C

## 2024-09-01 ENCOUNTER — Encounter: Payer: Self-pay | Admitting: Gastroenterology

## 2024-09-01 ENCOUNTER — Other Ambulatory Visit (HOSPITAL_BASED_OUTPATIENT_CLINIC_OR_DEPARTMENT_OTHER): Payer: Self-pay

## 2024-09-01 ENCOUNTER — Ambulatory Visit: Admitting: Gastroenterology

## 2024-09-01 VITALS — BP 136/88 | HR 84 | Ht 60.5 in | Wt 330.5 lb

## 2024-09-01 DIAGNOSIS — D500 Iron deficiency anemia secondary to blood loss (chronic): Secondary | ICD-10-CM | POA: Diagnosis not present

## 2024-09-01 DIAGNOSIS — K9089 Other intestinal malabsorption: Secondary | ICD-10-CM | POA: Diagnosis not present

## 2024-09-01 DIAGNOSIS — Z1211 Encounter for screening for malignant neoplasm of colon: Secondary | ICD-10-CM

## 2024-09-01 MED ORDER — NA SULFATE-K SULFATE-MG SULF 17.5-3.13-1.6 GM/177ML PO SOLN
1.0000 | Freq: Once | ORAL | 0 refills | Status: AC
Start: 2024-09-01 — End: 2024-09-02
  Filled 2024-09-01: qty 354, 1d supply, fill #0

## 2024-09-01 MED ORDER — CHOLESTYRAMINE 4 G PO PACK
4.0000 g | PACK | Freq: Every day | ORAL | 0 refills | Status: AC
  Filled 2024-09-01: qty 120, 40d supply, fill #0
  Filled 2024-09-03: qty 90, 90d supply, fill #0

## 2024-09-01 NOTE — Progress Notes (Signed)
 Chief Complaint:Iron  deficiency anemia due to chronic blood loss , colonoscopy Primary GI Doctor:Dr. San  HPI:  Patient is a  48  year old female patient with past medical history of DM,hypertension, iron  deficiency anemia, and obesity, who was referred to me by Rothfuss, Lang DASEN, PA-C on 05/04/24 for a evaluation of Iron  deficiency anemia due to chronic blood loss, colonoscopy.  03/11/2024 patient seen by hematology for longstanding history of iron  deficiency anemia.  Reviewed entire note.   Interval History   Patient presents for evaluation  of iron  deficiency anemia. She reports she has been getting IV iron  intermittently  for the past 3-4 years.  Patient's last infusion was in [place]. she does not tolerate oral iron . She does consume meat, not vegan. No blood donations. She still has menstrual cycles that lasts up to 6 days and reports it is heavy bleeding, she doubles her pads. She reports she has not been seen by OB/GYN for several years. Not on OC. No hematemesis.  She denies blood in stool.  She uses ibuprofen OTC few times a week.  Denies GERD or dysphagia.  Patient denies nausea, vomiting, or weight loss.  She reports diarrhea since her gallbladder was removed. She reports the diarrhea follows most meals.   No blood thinners.  She had an EGD in April 2013 with Dr. Larene for upper abdominal pain, which did not reveal any abnormality.   She had colonoscopy with Dr. Towana in June 2015, which was also normal.   No alcohol use. Nonsmoker.  Surgical history: lap chole, knee surgery  Patient's family history: adopted, unknown  Wt Readings from Last 3 Encounters:  09/01/24 (!) 330 lb 8 oz (149.9 kg)  08/31/24 (!) 331 lb (150.1 kg)  08/03/24 (!) 329 lb 12.8 oz (149.6 kg)    Past Medical History:  Diagnosis Date   Asthma    Diabetes mellitus without complication (HCC)    Folate deficiency 11/24/2021   HLD (hyperlipidemia)    Hypertension    Iron  deficiency anemia  08/25/2020   Iron  deficiency anemia secondary to blood loss (chronic)     Past Surgical History:  Procedure Laterality Date   CHOLECYSTECTOMY     KNEE ARTHROSCOPY W/ MENISCAL REPAIR Right 05/10/2023    Current Outpatient Medications  Medication Sig Dispense Refill   albuterol  (PROVENTIL ) (2.5 MG/3ML) 0.083% nebulizer solution Take 3 mLs (2.5 mg total) by nebulization every 6 (six) hours as needed for wheezing or shortness of breath. 150 mL 1   albuterol  (VENTOLIN  HFA) 108 (90 Base) MCG/ACT inhaler Inhale 2 puffs into the lungs every 6 (six) hours as needed for wheezing or shortness of breath. 6.7 g 0   atorvastatin  (LIPITOR) 40 MG tablet Take 1.5 tablets (60 mg total) by mouth daily. 90 tablet 1   Continuous Glucose Receiver (FREESTYLE LIBRE 3 READER) DEVI 1 Device by Does not apply route daily. 1 each 0   Continuous Glucose Sensor (FREESTYLE LIBRE 3 SENSOR) MISC Place 1 each onto the skin every 14 (fourteen) days. Place 1 sensor on the skin every 14 days. Use to check glucose continuously 2 each 5   cyclobenzaprine  (FLEXERIL ) 10 MG tablet Take 1 tablet (10 mg total) by mouth 3 (three) times daily as needed for muscle spasms. 30 tablet 0   empagliflozin  (JARDIANCE ) 10 MG TABS tablet Take 1 tablet (10 mg total) by mouth daily. In one month, we will plan to increase to the 25 mg tablets. 30 tablet 0   insulin  glargine (  LANTUS  SOLOSTAR) 100 UNIT/ML Solostar Pen Inject 25 Units into the skin at bedtime. 15 mL 3   Iron -Vitamin C (VITRON-C PO) Take by mouth daily.     meloxicam  (MOBIC ) 7.5 MG tablet Take 1 tablet (7.5 mg total) by mouth daily. 30 tablet 0   metFORMIN  (GLUCOPHAGE -XR) 500 MG 24 hr tablet Take 2 tablets (1,000 mg total) by mouth 2 (two) times daily. Start with 2 tablets in the morning and one at night one week, then go to 2 tablets in the morning and 2 at night thereafter. 120 tablet 6   olmesartan  (BENICAR ) 5 MG tablet Take 1 tablet (5 mg total) by mouth daily. 90 tablet 3   No  current facility-administered medications for this visit.    Allergies as of 09/01/2024 - Review Complete 09/01/2024  Allergen Reaction Noted   Latex  09/12/2020    Family History  Adopted: Yes    Review of Systems:    Constitutional: No weight loss, fever, chills, weakness or fatigue HEENT: Eyes: No change in vision               Ears, Nose, Throat:  No change in hearing or congestion Skin: No rash or itching Cardiovascular: No chest pain, chest pressure or palpitations   Respiratory: No SOB or cough Gastrointestinal: See HPI and otherwise negative Genitourinary: No dysuria or change in urinary frequency Neurological: No headache, dizziness or syncope Musculoskeletal: No new muscle or joint pain Hematologic: No bleeding or bruising Psychiatric: No history of depression or anxiety    Physical Exam:  Vital signs: BP 136/88 (BP Location: Left Arm, Patient Position: Sitting, Cuff Size: Large)   Pulse 84   Ht 5' 0.5 (1.537 m) Comment: height measured without shoes  Wt (!) 330 lb 8 oz (149.9 kg)   LMP 08/24/2024 (Approximate)   BMI 63.48 kg/m   Constitutional:   Pleasant female appears to be in NAD, Well developed, Well nourished, alert and cooperative Throat: Oral cavity and pharynx without inflammation, swelling or lesion.  Respiratory: Respirations even and unlabored. Lungs clear to auscultation bilaterally.   No wheezes, crackles, or rhonchi.  Cardiovascular: Normal S1, S2. Regular rate and rhythm. No peripheral edema, cyanosis or pallor.  Gastrointestinal:  Soft, nondistended, nontender. No rebound or guarding. Normal bowel sounds. No appreciable masses or hepatomegaly. Rectal:  Not performed.  Msk:  Symmetrical without gross deformities. Without edema, no deformity or joint abnormality.  Neurologic:  Alert and  oriented x4;  grossly normal neurologically.  Skin:   Dry and intact without significant lesions or rashes.  RELEVANT LABS AND IMAGING: CBC    Latest Ref  Rng & Units 08/03/2024   10:38 AM 03/11/2024    3:06 PM 10/30/2023    2:37 PM  CBC  WBC 3.4 - 10.8 x10E3/uL 8.4  9.7  10.4   Hemoglobin 11.1 - 15.9 g/dL 85.1  86.6  85.8   Hematocrit 34.0 - 46.6 % 46.4  40.0  41.7   Platelets 150 - 450 x10E3/uL 441  359  326      CMP     Latest Ref Rng & Units 08/12/2024    9:56 AM 08/03/2024   10:38 AM 03/11/2024    3:06 PM  CMP  Glucose 70 - 99 mg/dL 708  582  781   BUN 6 - 24 mg/dL 9  16  13    Creatinine 0.57 - 1.00 mg/dL 9.26  8.96  9.30   Sodium 134 - 144 mmol/L 134  135  136   Potassium 3.5 - 5.2 mmol/L 4.4  4.6  4.2   Chloride 96 - 106 mmol/L 98  97  97   CO2 20 - 29 mmol/L 23  22  27    Calcium  8.7 - 10.2 mg/dL 9.2  9.7  9.5   Total Protein 6.0 - 8.5 g/dL  6.8  7.0   Total Bilirubin 0.0 - 1.2 mg/dL  0.3  0.2   Alkaline Phos 41 - 116 IU/L  113  91   AST 0 - 40 IU/L  10  15   ALT 0 - 32 IU/L  15  13    Lab Results  Component Value Date   IRON  32 03/11/2024   TIBC 377 03/11/2024   FERRITIN 9 (L) 03/11/2024  07/2024 echo- Left ventricular ejection fraction, by estimation, is 60 to 65%.    Assessment: Encounter Diagnoses  Name Primary?   Iron  deficiency anemia due to chronic blood loss Yes   Bile salt-induced diarrhea    Special screening for malignant neoplasms, colon   48 year old female patient with longstanding history of iron  deficiency anemia that requires IV iron  patient does not tolerate oral iron .  Patient does report menorrhagia  and has not been seen by OB/GYN. Recommended she make an appointment. Patient's last endoscopy was back in April 2013 and colonoscopy back in June 2015, both reported normal.  Will go ahead and proceed with upper GI endoscopy and colonoscopy to screen for possible cause of anemia as well as surveillance screening. Patient also reports chronic diarrhea since she had her gallbladder removed, we will go ahead and start patient on cholestyramine 4 g p.o. daily.  Plan: -continue appointments with hematology  for IV iron  -recommend seeing OB/GYN for menorrhagia  -cholestyramine 4grams po daily -Schedule EGD in hospital with Dr. San (Due to BMI). The risks and benefits of EGD with possible biopsies and esophageal dilation were discussed with the patient who agrees to proceed. -Schedule for a colonoscopy in hospital with Dr. San . The risks and benefits of colonoscopy with possible polypectomy / biopsies were discussed and the patient agrees to proceed.     Thank you for the courtesy of this consult. Please call me with any questions or concerns.   Zykeria Laguardia, FNP-C [facility] 09/01/2024, 11:07 AM  Cc: Rothfuss, Lang DASEN, PA-C

## 2024-09-01 NOTE — Patient Instructions (Addendum)
 Diarrhea Take cholestyramine at bedtime , take other drugs  at least 1 hour before or 4-6 hours after cholestyramine.  We have sent the following medications to your pharmacy for you to pick up at your convenience: SUPREP  You have been scheduled for an endoscopy and colonoscopy. Please follow the written instructions given to you at your visit today.  If you use inhalers (even only as needed), please bring them with you on the day of your procedure.  DO NOT TAKE 7 DAYS PRIOR TO TEST- Trulicity (dulaglutide) Ozempic , Wegovy (semaglutide ) Mounjaro  (tirzepatide ) Bydureon Bcise (exanatide extended release)  DO NOT TAKE 1 DAY PRIOR TO YOUR TEST Rybelsus (semaglutide ) Adlyxin (lixisenatide) Victoza (liraglutide) Byetta (exanatide) ___________________________________________________________________________ Due to recent changes in healthcare laws, you may see the results of your imaging and laboratory studies on MyChart before your provider has had a chance to review them.  We understand that in some cases there may be results that are confusing or concerning to you. Not all laboratory results come back in the same time frame and the provider may be waiting for multiple results in order to interpret others.  Please give us  48 hours in order for your provider to thoroughly review all the results before contacting the office for clarification of your results.   _______________________________________________________  If your blood pressure at your visit was 140/90 or greater, please contact your primary care physician to follow up on this.  _______________________________________________________  If you are age 85 or older, your body mass index should be between 23-30. Your Body mass index is 63.48 kg/m. If this is out of the aforementioned range listed, please consider follow up with your Primary Care Provider.  If you are age 95 or younger, your body mass index should be between 19-25. Your  Body mass index is 63.48 kg/m. If this is out of the aformentioned range listed, please consider follow up with your Primary Care Provider.   ________________________________________________________  The [facility] providers would like to encourage you to use MYCHART to communicate with providers for non-urgent requests or questions.  Due to long hold times on the telephone, sending your provider a message by [facility] may be a faster and more efficient way to get a response.  Please allow 48 business hours for a response.  Please remember that this is for non-urgent requests.  _______________________________________________________  [facility] is using a team-based approach to care.  Your team is made up of your doctor and two to three APPS. Our APPS (Nurse Practitioners and Physician Assistants) work with your physician to ensure care continuity for you. They are fully qualified to address your health concerns and develop a treatment plan. They communicate directly with your gastroenterologist to care for you. Seeing the Advanced Practice Practitioners on your physician's team can help you by facilitating care more promptly, often allowing for earlier appointments, access to diagnostic testing, procedures, and other specialty referrals.   Thank you for trusting me with your gastrointestinal care. Deanna May, FNP-C

## 2024-09-02 ENCOUNTER — Other Ambulatory Visit (HOSPITAL_BASED_OUTPATIENT_CLINIC_OR_DEPARTMENT_OTHER): Payer: Self-pay

## 2024-09-03 ENCOUNTER — Other Ambulatory Visit: Payer: Self-pay

## 2024-09-03 ENCOUNTER — Other Ambulatory Visit (HOSPITAL_BASED_OUTPATIENT_CLINIC_OR_DEPARTMENT_OTHER): Payer: Self-pay

## 2024-09-03 ENCOUNTER — Other Ambulatory Visit (HOSPITAL_COMMUNITY): Payer: Self-pay

## 2024-09-03 ENCOUNTER — Encounter: Payer: Self-pay | Admitting: Pharmacist

## 2024-09-07 ENCOUNTER — Other Ambulatory Visit: Payer: Self-pay

## 2024-09-07 NOTE — Progress Notes (Signed)
 09/14/2024 Name: Leah Mendoza MRN: [id number] DOB: 30-Dec-1975  Chief Complaint  Patient presents with   Medication Management   Diabetes    Leah Mendoza is a 48 y.o. year old female who presented for a telephone visit.   They were referred to the pharmacist by their PCP for assistance in managing diabetes and complex medication management.    Subjective:  Care Team: Primary Care Provider: Iven Kaisen Mendoza T, PA-C ; Next Scheduled Visit:   Future Appointments  Date Time Provider Department Center  09/14/2024  9:00 AM Leah Mendoza, [facility] None  09/16/2024  9:30 AM [facility] None  09/16/2024 10:00 AM Leah Mendoza LABOR, NP [facility] None  10/12/2024  9:30 AM Leah Mendoza DASEN, PA-C [facility] [address]  11/13/2024 10:00 AM Leah Ernst, MD [facility] None    Medication Access/Adherence  Current Pharmacy:  [id number] - [place],  - [address] AT [address] [place] [address] Phone: [phone] Fax: [phone]  [facility] [address] [place] [address] Phone: [phone] Fax: [phone]   Patient reports affordability concerns with their medications: Yes  Patient reports access/transportation concerns to their pharmacy: No  Patient reports adherence concerns with their medications:  No     Diabetes:  Current medications: metformin  2000mg /day; lantus  35 units daily, jardiance  and mounjaro  recently prescribed but not picked up.  Medications tried in the past:   Current glucose readings: 196 this morning for fasting. Last Tuesday possible dizziness, last 14 days 250-350. Has cgm. Now getting at no cost. Currently using freestyle libre 3 plus   Patient denies hypoglycemic s/sx including dizziness, shakiness, sweating. Patient denies hyperglycemic symptoms including polyuria, polydipsia, polyphagia, nocturia, neuropathy,  blurred vision.  Current meal patterns: cut back on rice and potatoes, pasta.   Current physical activity: hip pain, stiff in the morning. Walk to her moms daily  Current medication access support: n/a  Macrovascular and Microvascular Risk Reduction:  Statin? yes (atorvastatin  40mg ); ACEi/ARB? yes (olmesartan  5mg ) Last urinary albumin/creatinine ratio:  Lab Results  Component Value Date   MICRALBCREAT 67 (H) 08/03/2024   Last eye exam:   Last foot exam: 08/03/2024 Tobacco Use:  Tobacco Use: Low Risk  (09/01/2024)   Patient History    Smoking Tobacco Use: Never    Smokeless Tobacco Use: Never    Passive Exposure: Never     Objective:  Lab Results  Component Value Date   HGBA1C 13.1 (A) 08/03/2024   HGBA1C 13.1 08/03/2024    Lab Results  Component Value Date   CREATININE 0.73 08/12/2024   BUN 9 08/12/2024   NA 134 08/12/2024   K 4.4 08/12/2024   CL 98 08/12/2024   CO2 23 08/12/2024    Lab Results  Component Value Date   CHOL 209 (H) 08/03/2024   HDL 37 (L) 08/03/2024   LDLCALC 141 (H) 08/03/2024   TRIG 169 (H) 08/03/2024   CHOLHDL 5.6 (H) 08/03/2024     Assessment/Plan:   Diabetes: - Currently uncontrolled; goal A1c <7%. Cardiorenal risk reduction is optimized.. Blood pressure is at goal <130/80. LDL is not at goal.  - Reviewed long term cardiovascular and renal outcomes of uncontrolled blood sugar., Reviewed dietary modifications including cutting out rice and potatoes, working on pasta., Reviewed lifestyle modifications including low carb diet and walking for exercise., and Reviewed signs and symptoms of hypoglycemia. - Sent request to have freestyle libre cgm  connected.  Follow Up Plan: will review coverage information for CGM and SGLT2i. 1 wk telephone call  Leah Mendoza, PharmD, BCGP Clinical Pharmacist  [phone]

## 2024-09-10 ENCOUNTER — Other Ambulatory Visit

## 2024-09-10 ENCOUNTER — Ambulatory Visit: Admitting: Oncology

## 2024-09-14 ENCOUNTER — Other Ambulatory Visit: Payer: Self-pay

## 2024-09-14 NOTE — Progress Notes (Unsigned)
 09/14/2024 Name: Leah Mendoza MRN: [id number] DOB: 01/15/1976  No chief complaint on file.   Leah Mendoza Leah Mendoza is a 48 y.o. year old female who presented for a telephone visit.   They were referred to the pharmacist by their PCP for assistance in managing diabetes and complex medication management.    Subjective:  Care Team: Primary Care Provider: Iven Ulla Mckiernan T, PA-C ; Next Scheduled Visit:   Future Appointments  Date Time Provider Department Center  09/16/2024  9:30 AM [facility] None  09/16/2024 10:00 AM Harl Eleanor LABOR, NP [facility] None  10/12/2024  9:30 AM Rothfuss, Lang DASEN, PA-C [facility] [address]  11/13/2024 10:00 AM Dartha Ernst, MD [facility] None    Medication Access/Adherence  Current Pharmacy:  [id number] - [place], [place] - [address] [place] [address] Phone: [phone] Fax: [phone]  [facility] [address] [place] [address] Phone: [phone] Fax: [phone]   Patient reports affordability concerns with their medications: Yes  Patient reports access/transportation concerns to their pharmacy: No  Patient reports adherence concerns with their medications:  No     Diabetes:  Current medications: metformin  2000mg /day; lantus  35 units daily, jardiance  and mounjaro  recently prescribed but not picked up.  Medications tried in the past:   Current glucose readings: 196 this morning for fasting. Last Tuesday possible dizziness, last 14 days 250-350. Has cgm. Now getting at no cost. Currently using freestyle libre 3 plus   Patient denies hypoglycemic s/sx including dizziness, shakiness, sweating. Patient denies hyperglycemic symptoms including polyuria, polydipsia, polyphagia, nocturia, neuropathy, blurred vision.  Current meal patterns: cut back on rice and potatoes, pasta.   Current physical  activity: hip pain, stiff in the morning. Walk to her moms daily  Current medication access support: n/a  09/14/2024 update - connected to patients CGM      Macrovascular and Microvascular Risk Reduction:  Statin? yes (atorvastatin  40mg ); ACEi/ARB? yes (olmesartan  5mg ) Last urinary albumin/creatinine ratio:  Lab Results  Component Value Date   MICRALBCREAT 67 (H) 08/03/2024   Last eye exam:   Last foot exam: 08/03/2024 Tobacco Use:  Tobacco Use: Low Risk  (09/01/2024)   Patient History    Smoking Tobacco Use: Never    Smokeless Tobacco Use: Never    Passive Exposure: Never     Objective:  Lab Results  Component Value Date   HGBA1C 13.1 (A) 08/03/2024   HGBA1C 13.1 08/03/2024    Lab Results  Component Value Date   CREATININE 0.73 08/12/2024   BUN 9 08/12/2024   NA 134 08/12/2024   K 4.4 08/12/2024   CL 98 08/12/2024   CO2 23 08/12/2024    Lab Results  Component Value Date   CHOL 209 (H) 08/03/2024   HDL 37 (L) 08/03/2024   LDLCALC 141 (H) 08/03/2024   TRIG 169 (H) 08/03/2024   CHOLHDL 5.6 (H) 08/03/2024     Assessment/Plan:   Diabetes: - Currently uncontrolled; goal A1c <7%. Cardiorenal risk reduction is optimized.. Blood pressure is at goal <130/80. LDL is not at goal.  - Reviewed long term cardiovascular and renal outcomes of uncontrolled blood sugar., Reviewed dietary modifications including cutting out rice and potatoes, working on pasta., Reviewed lifestyle modifications including low carb diet and walking for exercise., and Reviewed signs and symptoms of hypoglycemia. - Sent request to have freestyle libre cgm connected.  Follow Up Plan: will review  coverage information for CGM and SGLT2i. 1 wk telephone call  09/14/2024 update - recommended to go up on lantus  as noted by PCP, now will take 35 units at bedtime - reviewed CGM readings with patient - improved with CMI 9%, average glucose 238 over past 14 days.   Lang Sieve, PharmD,  BCGP Clinical Pharmacist  [phone]

## 2024-09-16 ENCOUNTER — Inpatient Hospital Stay: Attending: Hematology and Oncology

## 2024-09-16 ENCOUNTER — Other Ambulatory Visit: Payer: Self-pay

## 2024-09-16 ENCOUNTER — Inpatient Hospital Stay: Admitting: Hematology and Oncology

## 2024-09-16 ENCOUNTER — Other Ambulatory Visit: Payer: Self-pay | Admitting: Hematology and Oncology

## 2024-09-16 VITALS — BP 136/79 | HR 80 | Temp 99.2°F | Resp 20 | Ht 60.5 in | Wt 339.3 lb

## 2024-09-16 DIAGNOSIS — D500 Iron deficiency anemia secondary to blood loss (chronic): Secondary | ICD-10-CM | POA: Diagnosis not present

## 2024-09-16 DIAGNOSIS — Z79899 Other long term (current) drug therapy: Secondary | ICD-10-CM | POA: Diagnosis not present

## 2024-09-16 DIAGNOSIS — D509 Iron deficiency anemia, unspecified: Secondary | ICD-10-CM | POA: Insufficient documentation

## 2024-09-16 DIAGNOSIS — N920 Excessive and frequent menstruation with regular cycle: Secondary | ICD-10-CM | POA: Insufficient documentation

## 2024-09-16 DIAGNOSIS — Z79620 Long term (current) use of immunosuppressive biologic: Secondary | ICD-10-CM | POA: Diagnosis not present

## 2024-09-16 LAB — CBC WITH DIFFERENTIAL (CANCER CENTER ONLY)
Abs Immature Granulocytes: 0.03 K/uL (ref 0.00–0.07)
Basophils Absolute: 0.1 K/uL (ref 0.0–0.1)
Basophils Relative: 1 %
Eosinophils Absolute: 0.2 K/uL (ref 0.0–0.5)
Eosinophils Relative: 2 %
HCT: 43.7 % (ref 36.0–46.0)
Hemoglobin: 14.6 g/dL (ref 12.0–15.0)
Immature Granulocytes: 0 %
Lymphocytes Relative: 24 %
Lymphs Abs: 1.9 K/uL (ref 0.7–4.0)
MCH: 29.6 pg (ref 26.0–34.0)
MCHC: 33.4 g/dL (ref 30.0–36.0)
MCV: 88.5 fL (ref 80.0–100.0)
Monocytes Absolute: 0.5 K/uL (ref 0.1–1.0)
Monocytes Relative: 6 %
Neutro Abs: 5.3 K/uL (ref 1.7–7.7)
Neutrophils Relative %: 67 %
Platelet Count: 394 K/uL (ref 150–400)
RBC: 4.94 MIL/uL (ref 3.87–5.11)
RDW: 12.6 % (ref 11.5–15.5)
WBC Count: 7.9 K/uL (ref 4.0–10.5)
nRBC: 0 % (ref 0.0–0.2)

## 2024-09-16 LAB — CMP (CANCER CENTER ONLY)
ALT: 19 U/L (ref 0–44)
AST: 15 U/L (ref 15–41)
Albumin: 4 g/dL (ref 3.5–5.0)
Alkaline Phosphatase: 97 U/L (ref 38–126)
Anion gap: 8 (ref 5–15)
BUN: 8 mg/dL (ref 6–20)
CO2: 27 mmol/L (ref 22–32)
Calcium: 8.7 mg/dL — ABNORMAL LOW (ref 8.9–10.3)
Chloride: 102 mmol/L (ref 98–111)
Creatinine: 0.72 mg/dL (ref 0.44–1.00)
GFR, Estimated: >60 mL/min (ref >60)
Glucose, Bld: 227 mg/dL — ABNORMAL HIGH (ref 70–99)
Potassium: 3.9 mmol/L (ref 3.5–5.1)
Sodium: 137 mmol/L (ref 135–145)
Total Bilirubin: 0.4 mg/dL (ref 0.0–1.2)
Total Protein: 7.3 g/dL (ref 6.5–8.1)

## 2024-09-16 LAB — IRON AND TIBC
Iron: 67 ug/dL (ref 28–170)
Saturation Ratios: 19 % (ref 10.4–31.8)
TIBC: 356 ug/dL (ref 250–450)
UIBC: 289 ug/dL

## 2024-09-16 LAB — FERRITIN: Ferritin: 97 ng/mL (ref 11–307)

## 2024-09-16 LAB — FOLATE: Folate: 7.8 ng/mL (ref >5.9)

## 2024-09-16 LAB — TSH: TSH: 3.19 u[IU]/mL (ref 0.350–4.500)

## 2024-09-16 LAB — VITAMIN B12: Vitamin B-12: 506 pg/mL (ref 180–914)

## 2024-09-16 NOTE — Progress Notes (Signed)
 [facility]  [address] [place],  [place]  [address] [phone]  Clinic Day:  03/11/24  Referring physician: Iven Lang DASEN, PA-C  ASSESSMENT & PLAN:  Assessment: Iron  deficiency anemia Longstanding history of iron  deficiency anemia with associated reactive thrombocytosis felt to be due to menorrhagia.  She has periodically received IV iron , as she does not tolerate oral iron .  She was found recurrent iron  deficiency in October when she had recurrent thrombocytosis, so received IV iron  again in November, 2024. I referred her back to gynecology for further recommendations regarding her menorrhagia and she is scheduled to be seen.  We also referred her to Dr. Larene for screening colonoscopy and this is scheduled for January, 2025  Plan: She had a screening bilateral mammogram done on 09/27/2023 which was clear. She has a WBC of 9.7, hemoglobin of 13.3, and platelet count of 359,000. Her CMP is normal other than a elevated glucose of 218. Her ferritin and iron  studies are pending. Her last IV iron  was in November, 2024. She informed me that her last menstrual cycle was light and they are not regular. I will see her back in 6 months with CBC, CMP, ferritin, and iron  studies. The patient understands the plans discussed today and is in agreement with them.  She knows to contact our office if she develops concerns prior to her next appointment.  I provided 20 minutes of face-to-face time during this encounter and > 50% was spent counseling as documented under my assessment and plan.   Leah Bach, FNP- [facility] [place] - A DEPT OF [facility] [address] [place] [address] Dept: [phone] Dept Fax: [phone]   No orders of the defined types were placed in this encounter.    CHIEF COMPLAINT:  CC: Iron  deficiency anemia  Current Treatment:  IV iron  as needed  HISTORY OF PRESENT ILLNESS:  Leah Mendoza is a 48 y.o. female with microcytic anemia secondary to iron  deficiency with associated reactive thrombocytosis who we began seeing in August 2014.  This has been an ongoing problem for years, felt to be due to menorrhagia.  She does not tolerate oral iron  supplementation, so receives IV iron  on an as needed basis.  She has been referred to gynecology in the past, but apparently there are financial issues.  She had an EGD in April 2013 with Dr. Larene for upper abdominal pain, which did not reveal any abnormality.  She had colonoscopy with Dr. Towana in June 2015, which was also normal.  Routine screening colonoscopy at age 61 was recommended, but now that has changed to age 71, so she likely will have repeat screening colonoscopy at age 9, which would be 10 years from the last.  She has received numerous IV iron  infusions over the years when she becomes more anemic and symptomatic.  CT abdomen and pelvis in June 2015 revealed endometrial thickening, for which we referred her to [facility] in June 2015, but she never went.  Repeat CT abdomen and pelvis and February 2016 in the emergency department revealed a left adnexal soft tissue density most consistent with an ovarian cyst. Pelvic ultrasound was recommended and revealed bilateral ovarian cysts. The endometrium looked normal on the ultrasound.  She was seen by GYN in 2019, but has not followed up.   She has required IV iron  regularly over the years.  Bilateral screening mammogram in January 2023 did not reveal  any evidence of malignancy.  She was found to have folate deficiency, which resolved with folic acid  daily.  Her ferritin dropped to 9 in October 29, so she was given receiving IV iron  in the form of Venofer  again. We referred her back to GYN for persistent menorrhagia, as well as Dr. Larene for screening colonoscopy.  She developed cellulitis at the IV site in the right antecubital area after iron  sucrose on  November 13.  She was given IM Rocephin  and placed on cephalexin  500 mg 3 times daily.  Due to the significant swelling, she had a stat ultrasound of the right upper extremity that was negative for DVT. The cellulitis resolved with antibiotic and she completed 5 doses of iron  sucrose.  Bilateral screening mammogram in November 2024 was negative.  INTERVAL HISTORY:  Leah Mendoza is here today for repeat clinical assessment for her iron  deficiency anemia. Patient states that she feels well and has no complaints of pain. She had a screening bilateral mammogram done on 09/27/2023 which was clear. She has a WBC of 9.7, hemoglobin of 13.3, and platelet count of 359,000. Her CMP is normal other than a elevated glucose of 218. Her ferritin and iron  studies are pending. Her last IV iron  was in November, 2024. She informed me that her last menstrual cycle was light and they are not regular. I will see her back in 6 months with CBC, CMP, ferritin, and iron  studies.   She denies fever, chills, night sweats, or other signs of infection. She denies cardiorespiratory and gastrointestinal issues. She denies pain. Her appetite is good. Her appetite is ok and her weight has decreased 12 pounds over last 5 months.   REVIEW OF SYSTEMS:  Review of Systems  Constitutional:  Negative for appetite change, chills, fatigue, fever and unexpected weight change.  HENT:  Negative.  Negative for lump/mass, mouth sores and sore throat.   Eyes: Negative.   Respiratory: Negative.  Negative for chest tightness, cough, hemoptysis, shortness of breath and wheezing.   Cardiovascular: Negative.  Negative for chest pain, leg swelling and palpitations.  Gastrointestinal: Negative.  Negative for abdominal distention, abdominal pain, blood in stool, constipation, diarrhea, nausea and vomiting.  Endocrine: Negative.  Negative for hot flashes.  Genitourinary:  Positive for menstrual problem. Negative for difficulty urinating, dysuria, frequency and  hematuria.   Musculoskeletal:  Negative for arthralgias, back pain, flank pain, gait problem and myalgias.  Skin: Negative.  Negative for rash.  Neurological:  Negative for dizziness, extremity weakness, gait problem, headaches, light-headedness, numbness, seizures and speech difficulty.  Hematological: Negative.  Negative for adenopathy. Does not bruise/bleed easily.  Psychiatric/Behavioral: Negative.  Negative for depression and sleep disturbance. The patient is not nervous/anxious.     VITALS:  Blood pressure 136/79, pulse 80, temperature 99.2 F (37.3 C), temperature source Oral, resp. rate 20, height 5' 0.5 (1.537 m), weight (!) 339 lb 4.8 oz (153.9 kg), last menstrual period 08/24/2024, SpO2 98%.  Wt Readings from Last 3 Encounters:  09/16/24 (!) 339 lb 4.8 oz (153.9 kg)  09/01/24 (!) 330 lb 8 oz (149.9 kg)  08/31/24 (!) 331 lb (150.1 kg)    Body mass index is 65.17 kg/m.  Performance status (ECOG): 0 - Asymptomatic  PHYSICAL EXAM:  Physical Exam Vitals and nursing note reviewed.  Constitutional:      General: She is not in acute distress.    Appearance: Normal appearance. She is normal weight. She is not ill-appearing, toxic-appearing or diaphoretic.  HENT:  Head: Normocephalic and atraumatic.     Right Ear: Tympanic membrane, ear canal and external ear normal. There is no impacted cerumen.     Left Ear: Tympanic membrane, ear canal and external ear normal. There is no impacted cerumen.     Nose: Nose normal. No congestion or rhinorrhea.     Mouth/Throat:     Mouth: Mucous membranes are moist.     Pharynx: Oropharynx is clear. No oropharyngeal exudate or posterior oropharyngeal erythema.  Eyes:     General: No scleral icterus.       Right eye: No discharge.        Left eye: No discharge.     Extraocular Movements: Extraocular movements intact.     Conjunctiva/sclera: Conjunctivae normal.     Pupils: Pupils are equal, round, and reactive to light.  Cardiovascular:      Rate and Rhythm: Normal rate and regular rhythm.     Pulses: Normal pulses.     Heart sounds: Normal heart sounds. No murmur heard.    No friction rub. No gallop.  Pulmonary:     Effort: Pulmonary effort is normal.     Breath sounds: Normal breath sounds. No wheezing, rhonchi or rales.  Abdominal:     General: Bowel sounds are normal. There is no distension.     Palpations: Abdomen is soft. There is no mass.     Tenderness: There is no abdominal tenderness.     Comments: Difficult to assess for hepatosplenomegaly due to body habitus  Musculoskeletal:        General: Normal range of motion.     Cervical back: Normal range of motion and neck supple. No tenderness.     Right lower leg: Edema (trace) present.     Left lower leg: Edema (trace) present.  Lymphadenopathy:     Cervical: No cervical adenopathy.     Upper Body:     Right upper body: No supraclavicular or axillary adenopathy.     Left upper body: No supraclavicular or axillary adenopathy.     Lower Body: No right inguinal adenopathy. No left inguinal adenopathy.  Skin:    General: Skin is warm and dry.     Coloration: Skin is not jaundiced.     Findings: No rash.  Neurological:     General: No focal deficit present.     Mental Status: She is alert and oriented to person, place, and time. Mental status is at baseline.     Cranial Nerves: No cranial nerve deficit.     Sensory: No sensory deficit.     Motor: No weakness.     Coordination: Coordination normal.     Gait: Gait normal.     Deep Tendon Reflexes: Reflexes normal.  Psychiatric:        Mood and Affect: Mood normal.        Behavior: Behavior normal.        Thought Content: Thought content normal.        Judgment: Judgment normal.    LABS:      Latest Ref Rng & Units 09/16/2024    8:57 AM 08/03/2024   10:38 AM 03/11/2024    3:06 PM  CBC  WBC 4.0 - 10.5 K/uL 7.9  8.4  9.7   Hemoglobin 12.0 - 15.0 g/dL 85.3  85.1  86.6   Hematocrit 36.0 - 46.0 % 43.7   46.4  40.0   Platelets 150 - 400 K/uL 394  441  359  Latest Ref Rng & Units 09/16/2024    8:57 AM 08/12/2024    9:56 AM 08/03/2024   10:38 AM  CMP  Glucose 70 - 99 mg/dL 772  708  582   BUN 6 - 20 mg/dL 8  9  16    Creatinine 0.44 - 1.00 mg/dL 9.27  9.26  8.96   Sodium 135 - 145 mmol/L 137  134  135   Potassium 3.5 - 5.1 mmol/L 3.9  4.4  4.6   Chloride 98 - 111 mmol/L 102  98  97   CO2 22 - 32 mmol/L 27  23  22    Calcium  8.9 - 10.3 mg/dL 8.7  9.2  9.7   Total Protein 6.5 - 8.1 g/dL 7.3   6.8   Total Bilirubin 0.0 - 1.2 mg/dL 0.4   0.3   Alkaline Phos 38 - 126 U/L 97   113   AST 15 - 41 U/L 15   10   ALT 0 - 44 U/L 19   15    Lab Results  Component Value Date   TIBC 377 03/11/2024   TIBC 385 10/30/2023   TIBC 455 (H) 09/17/2023   FERRITIN 9 (L) 03/11/2024   FERRITIN 65 10/30/2023   FERRITIN 9 (L) 09/17/2023   IRONPCTSAT 9 (L) 03/11/2024   IRONPCTSAT 20 10/30/2023   IRONPCTSAT 19 09/17/2023   Lab Results  Component Value Date   FOLATE 6.6 09/17/2023   STUDIES:     HISTORY:   Past Medical History:  Diagnosis Date   Asthma    Diabetes mellitus without complication (HCC)    Folate deficiency 11/24/2021   HLD (hyperlipidemia)    Hypertension    Iron  deficiency anemia 08/25/2020   Iron  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 She has never been exposed to tobacco smoke. She has never used smokeless tobacco. She reports that she does not currently use alcohol. She reports that she does not use drugs.The patient is alone today.  Allergies:  Allergies  Allergen Reactions   Latex     UNKNOWN    Current Medications: Current Outpatient Medications  Medication Sig Dispense Refill   albuterol  (PROVENTIL ) (2.5 MG/3ML) 0.083%  nebulizer solution Take 3 mLs (2.5 mg total) by nebulization every 6 (six) hours as needed for wheezing or shortness of breath. 150 mL 1   albuterol  (VENTOLIN  HFA) 108 (90 Base) MCG/ACT inhaler Inhale 2 puffs into the lungs every 6 (six) hours as needed for wheezing or shortness of breath. 6.7 g 0   atorvastatin  (LIPITOR) 40 MG tablet Take 1.5 tablets (60 mg total) by mouth daily. 90 tablet 1   cholestyramine (QUESTRAN) 4 g packet Take 1 packet (4 g total) by mouth daily. Suggest starting out once daily, can increase up to three times a day as needed, can cause constipation 120 each 0   cyclobenzaprine  (FLEXERIL ) 10 MG tablet Take 1 tablet (10 mg total) by mouth 3 (three) times daily as needed for muscle spasms. 30 tablet 0   empagliflozin  (JARDIANCE ) 10 MG TABS tablet Take 1 tablet (10 mg total) by mouth daily. In one month, we will plan to increase to the 25 mg tablets. 30 tablet 0   insulin  glargine (LANTUS  SOLOSTAR)  100 UNIT/ML Solostar Pen Inject 25 Units into the skin at bedtime. 15 mL 3   Iron -Vitamin C (VITRON-C PO) Take by mouth daily.     meloxicam  (MOBIC ) 7.5 MG tablet Take 1 tablet (7.5 mg total) by mouth daily. 30 tablet 0   metFORMIN  (GLUCOPHAGE -XR) 500 MG 24 hr tablet Take 2 tablets (1,000 mg total) by mouth 2 (two) times daily. Start with 2 tablets in the morning and one at night one week, then go to 2 tablets in the morning and 2 at night thereafter. 120 tablet 6   olmesartan  (BENICAR ) 5 MG tablet Take 1 tablet (5 mg total) by mouth daily. 90 tablet 3   Continuous Glucose Receiver (FREESTYLE LIBRE 3 READER) DEVI 1 Device by Does not apply route daily. 1 each 0   Continuous Glucose Sensor (FREESTYLE LIBRE 3 SENSOR) MISC Place 1 each onto the skin every 14 (fourteen) days. Place 1 sensor on the skin every 14 days. Use to check glucose continuously 2 each 5   No current facility-administered medications for this visit.

## 2024-09-23 NOTE — Progress Notes (Signed)
 Agree with the assessment and plan as outlined by [facility], FNP-C.  Leah Bottino, DO, [facility]

## 2024-10-12 ENCOUNTER — Ambulatory Visit (INDEPENDENT_AMBULATORY_CARE_PROVIDER_SITE_OTHER): Admitting: Student

## 2024-10-12 ENCOUNTER — Encounter (HOSPITAL_BASED_OUTPATIENT_CLINIC_OR_DEPARTMENT_OTHER): Payer: Self-pay | Admitting: Student

## 2024-10-12 ENCOUNTER — Other Ambulatory Visit (HOSPITAL_BASED_OUTPATIENT_CLINIC_OR_DEPARTMENT_OTHER): Payer: Self-pay

## 2024-10-12 ENCOUNTER — Encounter (HOSPITAL_COMMUNITY): Payer: Self-pay | Admitting: Gastroenterology

## 2024-10-12 VITALS — BP 131/82 | HR 84 | Temp 98.1°F | Resp 16 | Ht 60.5 in | Wt 337.2 lb

## 2024-10-12 DIAGNOSIS — E1169 Type 2 diabetes mellitus with other specified complication: Secondary | ICD-10-CM | POA: Diagnosis not present

## 2024-10-12 DIAGNOSIS — E1165 Type 2 diabetes mellitus with hyperglycemia: Secondary | ICD-10-CM | POA: Diagnosis not present

## 2024-10-12 DIAGNOSIS — Z794 Long term (current) use of insulin: Secondary | ICD-10-CM

## 2024-10-12 DIAGNOSIS — E785 Hyperlipidemia, unspecified: Secondary | ICD-10-CM | POA: Diagnosis not present

## 2024-10-12 DIAGNOSIS — I10 Essential (primary) hypertension: Secondary | ICD-10-CM

## 2024-10-12 DIAGNOSIS — Z6841 Body Mass Index (BMI) 40.0 and over, adult: Secondary | ICD-10-CM

## 2024-10-12 MED ORDER — EMPAGLIFLOZIN 25 MG PO TABS
25.0000 mg | ORAL_TABLET | Freq: Every day | ORAL | 3 refills | Status: AC
  Filled 2024-10-12: qty 90, 90d supply, fill #0

## 2024-10-12 NOTE — Progress Notes (Signed)
 Established Patient Office Visit  Subjective   Patient ID: Leah Mendoza, female    DOB: 1976-02-12  Age: 48 y.o. MRN: [id number]  Chief Complaint  Patient presents with   Medical Management of Chronic Issues    Follow up.    HPI  Discussed the use of AI scribe software for clinical note transcription with the patient, who gave verbal consent to proceed.  History of Present Illness   Leah Mendoza is a 48 year old female with uncontrolled diabetes who presents for follow-up on her blood sugar management.  Her blood sugar today is 135 mg/dL. Her average glucose over the last seven days is approximately 187 mg/dL. Over the last 30 days, her estimated A1c is 7.9%, a significant improvement from a previous A1c of 13%. She is currently on 35 units of basal insulin  nightly. She has also been taking Jardiance . She has made dietary changes, including cutting out pasta and sugar sodas, and is trying sparkling water and flavor packs. She has been walking regularly, which helps with her blood sugar levels.  She has not yet seen Dr. Joesph for a bariatric surgical consultation.  Her blood pressure is being monitored, and well controlled.   No recent need for albuterol .   She has not had an eye exam this year and plans to schedule one soon. She is scheduled for a GI appointment for an endoscopy and colonoscopy next Tuesday. She has not yet met with an OBGYN for a Pap smear but is considering options in [place] or [place].      Patient Active Problem List   Diagnosis Date Noted   Hyperlipidemia associated with type 2 diabetes mellitus (HCC) 08/31/2024   AKI (acute kidney injury) 08/07/2024   Morbid obesity (HCC) 01/27/2024   Diabetes mellitus without complication (HCC)    Cellulitis at injection site 10/03/2023   Leukocytosis 09/18/2023   Hypertension    Folate deficiency 11/24/2021   Iron  deficiency anemia 08/25/2020   Past Medical History:  Diagnosis  Date   Asthma    Diabetes mellitus without complication (HCC)    Folate deficiency 11/24/2021   HLD (hyperlipidemia)    Hypertension    Iron  deficiency anemia 08/25/2020   Iron  deficiency anemia secondary to blood loss (chronic)    Social History   Tobacco Use   Smoking status: Never    Passive exposure: Never   Smokeless tobacco: Never  Vaping Use   Vaping status: Never Used  Substance Use Topics   Alcohol use: Not Currently   Drug use: Never   Allergies  Allergen Reactions   Latex     UNKNOWN      ROS Per HPI.    Objective:     BP 131/82   Pulse 84   Temp 98.1 F (36.7 C) (Oral)   Resp 16   Ht 5' 0.5 (1.537 m)   Wt (!) 337 lb 3.2 oz (153 kg)   LMP 09/24/2024 (Approximate)   SpO2 97%   BMI 64.77 kg/m  BP Readings from Last 3 Encounters:  10/12/24 131/82  09/16/24 136/79  09/01/24 136/88   Wt Readings from Last 3 Encounters:  10/12/24 (!) 337 lb 3.2 oz (153 kg)  09/16/24 (!) 339 lb 4.8 oz (153.9 kg)  09/01/24 (!) 330 lb 8 oz (149.9 kg)   SpO2 Readings from Last 3 Encounters:  10/12/24 97%  09/16/24 98%  08/31/24 97%      Physical Exam Constitutional:  General: She is not in acute distress.    Appearance: Normal appearance. She is not ill-appearing.  HENT:     Head: Normocephalic and atraumatic.     Nose: Nose normal.  Eyes:     General: No scleral icterus.    Conjunctiva/sclera: Conjunctivae normal.  Cardiovascular:     Rate and Rhythm: Normal rate and regular rhythm.     Heart sounds: Normal heart sounds. No murmur heard.    No friction rub.  Pulmonary:     Effort: Pulmonary effort is normal. No respiratory distress.     Breath sounds: Normal breath sounds. No wheezing, rhonchi or rales.  Musculoskeletal:        General: Normal range of motion.  Skin:    General: Skin is warm and dry.     Coloration: Skin is not jaundiced or pale.  Neurological:     General: No focal deficit present.     Mental Status: She is alert.   Psychiatric:        Mood and Affect: Mood normal.        Behavior: Behavior normal.      No results found for any visits on 10/12/24.  Last CBC Lab Results  Component Value Date   WBC 7.9 09/16/2024   HGB 14.6 09/16/2024   HCT 43.7 09/16/2024   MCV 88.5 09/16/2024   MCH 29.6 09/16/2024   RDW 12.6 09/16/2024   PLT 394 09/16/2024   Last metabolic panel Lab Results  Component Value Date   GLUCOSE 227 (H) 09/16/2024   NA 137 09/16/2024   K 3.9 09/16/2024   CL 102 09/16/2024   CO2 27 09/16/2024   BUN 8 09/16/2024   CREATININE 0.72 09/16/2024   GFRNONAA >60 09/16/2024   CALCIUM  8.7 (L) 09/16/2024   PROT 7.3 09/16/2024   ALBUMIN 4.0 09/16/2024   LABGLOB 2.8 08/03/2024   BILITOT 0.4 09/16/2024   ALKPHOS 97 09/16/2024   AST 15 09/16/2024   ALT 19 09/16/2024   ANIONGAP 8 09/16/2024   Last lipids Lab Results  Component Value Date   CHOL 209 (H) 08/03/2024   HDL 37 (L) 08/03/2024   LDLCALC 141 (H) 08/03/2024   TRIG 169 (H) 08/03/2024   CHOLHDL 5.6 (H) 08/03/2024   Last hemoglobin A1c Lab Results  Component Value Date   HGBA1C 13.1 (A) 08/03/2024   HGBA1C 13.1 08/03/2024      The 10-year ASCVD risk score (Arnett DK, et al., 2019) is: 13.9%    Assessment & Plan:    Assessment and Plan    Type 2 diabetes mellitus with hyperglycemia Chronic, not at goal. Blood glucose levels have improved with an average of 187 mg/dL over the last seven days. Last A1c about 2 months ago was 13%. Current estimated a1c is about 7.8% through [facility]. Current insulin  regimen includes 35 units of basal insulin  nightly. Suspect that mealtime insulin  would be beneficial as well- we will try to maximize oral regimen prior. Jardiance  has been started but requires dose adjustment. No mealtime insulin  is currently being used. - Increased basal insulin  to 38 units nightly. - Increased Jardiance  to 25 mg daily. - Instructed to monitor blood glucose levels and report if levels drop below 70  mg/dL. - Advised to consume juice if blood glucose drops below 70 mg/dL, with repeat every 15 minutes if necessary. - Encouraged walking after meals to help regulate blood glucose levels. - Initial visit with endocrine scheduled for Decemeber.  Morbid obesity Weight management is a  concern. Referral to a weight management specialist has been made, but she has not yet seen the specialist. Discussion of potential surgical intervention with Dr. Joesph is advised. Alternative weight loss clinic options are available if surgical intervention is not pursued. - Encouraged follow-up with Dr. Joesph for potential surgical intervention. - Provided information on weight loss clinic in [place] as an alternative option.  Hypertension Blood pressure is currently well-managed, but the diastolic pressure needs to be below 80 mmHg. Counseled to continue lifestyle changes. If she goes through with bariatric surgery, this should help as well.  - Rechecked blood pressure before leaving the clinic.  Hyperlipidemia Chronic, stable. Cholesterol levels are currently managed with Lipitor. - Continue current Lipitor regimen.      Return in about 4 months (around 02/09/2025) for DM, HTN.    Amber Guthridge T Sherl Yzaguirre, PA-C

## 2024-10-12 NOTE — Patient Instructions (Addendum)
 It was nice to see you today!  As we discussed in clinic: - Please schedule an eye exam and papsmear.  - Please increase your insulin  to 38 units nightly. - We are increasing your jardiance  to 25 mg daily - Try walking after your meals - Please make sure to follow up with Dr. Joesph and call him back (the general surgeon).  If you have any problems before your next visit feel free to message me via MyChart (minor issues or questions) or call the office, otherwise you may reach out to schedule an office visit.  Thank you! Dyllan Hughett, PA-C

## 2024-10-12 NOTE — Progress Notes (Signed)
 Attempted to obtain medical history for pre op call via telephone, unable to reach at this time. HIPAA compliant voicemail message left requesting return call to pre surgical testing department.

## 2024-10-14 ENCOUNTER — Telehealth: Payer: Self-pay

## 2024-10-14 NOTE — Telephone Encounter (Signed)
 Procedure:EGD Procedure date: 10/20/24 Procedure location: [facility] Arrival Time: 8:51 Spoke with the patient Y/N: N Any prep concerns? N  Has the patient obtained the prep from the pharmacy ? N Do you have a care partner and transportation: N Any additional concerns? N  I called patient on 3 different occasion. I left the patient a detailed message about the procedure and the office number in case patient has questions are concerns.

## 2024-10-14 NOTE — Telephone Encounter (Signed)
 Called and spoke with patient to confirm EGD/colon at [facility] on 10/20/24 with Dr. San. Patient is aware that she will need to arrive at 8:50 am with a care partner. Patient has her prep and had no questions or concerns.

## 2024-10-14 NOTE — Telephone Encounter (Signed)
 Thanks for the update. Hopefully can establish contact so that she can stay as scheduled for 12/2.

## 2024-10-20 ENCOUNTER — Encounter (HOSPITAL_COMMUNITY): Payer: Self-pay | Admitting: Gastroenterology

## 2024-10-20 ENCOUNTER — Ambulatory Visit (HOSPITAL_COMMUNITY)
Admission: RE | Admit: 2024-10-20 | Discharge: 2024-10-20 | Disposition: A | Discharge status: Home / Self Care | Attending: Gastroenterology | Admitting: Gastroenterology

## 2024-10-20 ENCOUNTER — Encounter (HOSPITAL_COMMUNITY): Payer: Self-pay | Admitting: Anesthesiology

## 2024-10-20 ENCOUNTER — Encounter (HOSPITAL_COMMUNITY)
Admission: RE | Disposition: A | Discharge status: Home / Self Care | Payer: Self-pay | Source: Home / Self Care | Attending: Gastroenterology

## 2024-10-20 ENCOUNTER — Other Ambulatory Visit: Payer: Self-pay

## 2024-10-20 ENCOUNTER — Ambulatory Visit (HOSPITAL_COMMUNITY): Payer: Self-pay | Admitting: Anesthesiology

## 2024-10-20 DIAGNOSIS — I10 Essential (primary) hypertension: Secondary | ICD-10-CM | POA: Diagnosis not present

## 2024-10-20 DIAGNOSIS — Z7984 Long term (current) use of oral hypoglycemic drugs: Secondary | ICD-10-CM | POA: Diagnosis not present

## 2024-10-20 DIAGNOSIS — Z1211 Encounter for screening for malignant neoplasm of colon: Secondary | ICD-10-CM | POA: Diagnosis not present

## 2024-10-20 DIAGNOSIS — K2980 Duodenitis without bleeding: Secondary | ICD-10-CM | POA: Diagnosis not present

## 2024-10-20 DIAGNOSIS — Z6841 Body Mass Index (BMI) 40.0 and over, adult: Secondary | ICD-10-CM | POA: Diagnosis not present

## 2024-10-20 DIAGNOSIS — Z791 Long term (current) use of non-steroidal anti-inflammatories (NSAID): Secondary | ICD-10-CM | POA: Diagnosis not present

## 2024-10-20 DIAGNOSIS — E6689 Other obesity not elsewhere classified: Secondary | ICD-10-CM | POA: Diagnosis not present

## 2024-10-20 DIAGNOSIS — D509 Iron deficiency anemia, unspecified: Secondary | ICD-10-CM | POA: Diagnosis not present

## 2024-10-20 DIAGNOSIS — D500 Iron deficiency anemia secondary to blood loss (chronic): Secondary | ICD-10-CM | POA: Diagnosis not present

## 2024-10-20 DIAGNOSIS — K3189 Other diseases of stomach and duodenum: Secondary | ICD-10-CM | POA: Diagnosis not present

## 2024-10-20 DIAGNOSIS — K9089 Other intestinal malabsorption: Secondary | ICD-10-CM

## 2024-10-20 DIAGNOSIS — R194 Change in bowel habit: Secondary | ICD-10-CM | POA: Diagnosis not present

## 2024-10-20 DIAGNOSIS — Z79899 Other long term (current) drug therapy: Secondary | ICD-10-CM | POA: Diagnosis not present

## 2024-10-20 DIAGNOSIS — E119 Type 2 diabetes mellitus without complications: Secondary | ICD-10-CM | POA: Diagnosis not present

## 2024-10-20 DIAGNOSIS — Z7951 Long term (current) use of inhaled steroids: Secondary | ICD-10-CM | POA: Diagnosis not present

## 2024-10-20 DIAGNOSIS — Z794 Long term (current) use of insulin: Secondary | ICD-10-CM | POA: Diagnosis not present

## 2024-10-20 DIAGNOSIS — K31A19 Gastric intestinal metaplasia without dysplasia, unspecified site: Secondary | ICD-10-CM | POA: Diagnosis not present

## 2024-10-20 DIAGNOSIS — R197 Diarrhea, unspecified: Secondary | ICD-10-CM | POA: Diagnosis not present

## 2024-10-20 DIAGNOSIS — K640 First degree hemorrhoids: Secondary | ICD-10-CM

## 2024-10-20 HISTORY — PX: COLONOSCOPY: SHX5424

## 2024-10-20 HISTORY — PX: ESOPHAGOGASTRODUODENOSCOPY: SHX5428

## 2024-10-20 HISTORY — PX: BIOPSY OF SKIN SUBCUTANEOUS TISSUE AND/OR MUCOUS MEMBRANE: SHX6741

## 2024-10-20 LAB — GLUCOSE, CAPILLARY: Glucose-Capillary: 136 mg/dL — ABNORMAL HIGH (ref 70–99)

## 2024-10-20 SURGERY — EGD (ESOPHAGOGASTRODUODENOSCOPY)
Anesthesia: Monitor Anesthesia Care

## 2024-10-20 MED ORDER — SODIUM CHLORIDE 0.9 % IV SOLN: INTRAVENOUS | Status: DC

## 2024-10-20 MED ORDER — SODIUM CHLORIDE 0.9 % IV SOLN: INTRAVENOUS | Status: DC | PRN

## 2024-10-20 MED ORDER — PROPOFOL 500 MG/50ML IV EMUL
INTRAVENOUS | Status: DC | PRN
  Administered 2024-10-20: 120 ug/kg/min via INTRAVENOUS

## 2024-10-20 MED ORDER — PROPOFOL 10 MG/ML IV BOLUS
INTRAVENOUS | Status: DC | PRN
  Administered 2024-10-20: 70 mg via INTRAVENOUS

## 2024-10-20 MED ORDER — PROPOFOL 1000 MG/100ML IV EMUL
INTRAVENOUS | Status: AC
  Filled 2024-10-20: qty 100

## 2024-10-20 NOTE — H&P (Signed)
 GASTROENTEROLOGY PROCEDURE H&P NOTE   Primary Care Physician: Iven Lang DASEN, PA-C    Reason for Procedure:  Iron -deficiency anemia, change in bowel habits  Plan:    EGD, colonoscopy   Patient is appropriate for endoscopic procedure(s) at [facility] End[facility].  The nature of the procedure, as well as the risks, benefits, and alternatives were carefully and thoroughly reviewed with the patient. Ample time for discussion and questions allowed. The patient understood, was satisfied, and agreed to proceed. I personally addressed all patient questions and concerns.     HPI: Leah Mendoza is a 48 y.o. female who presents for EGD and colonoscopy for evaluation of iron  deficiency anemia.  Additionally, has been having diarrhea since cholecystectomy.  Recently started on cholestyramine .  She had an EGD in April 2013 with Dr. Larene for upper abdominal pain, which did not reveal any abnormality.    She had colonoscopy with Dr. Towana in June 2015, which was also normal.   Procedures scheduled at [facility] Endoscopy unit due to elevated periprocedural risks of underlying coronaries, to include morbid obesity with BMI >50.  Past Medical History:  Diagnosis Date   Asthma    Diabetes mellitus without complication (HCC)    Folate deficiency 11/24/2021   HLD (hyperlipidemia)    Hypertension    Iron  deficiency anemia 08/25/2020   Iron  deficiency anemia secondary to blood loss (chronic)     Past Surgical History:  Procedure Laterality Date   CHOLECYSTECTOMY     KNEE ARTHROSCOPY W/ MENISCAL REPAIR Right 05/10/2023    Prior to Admission medications   Medication Sig Start Date End Date Taking? Authorizing Provider  albuterol  (PROVENTIL ) (2.5 MG/3ML) 0.083% nebulizer solution Take 3 mLs (2.5 mg total) by nebulization every 6 (six) hours as needed for wheezing or shortness of breath. 12/16/23  Yes Rothfuss, Jacob T, PA-C  albuterol  (VENTOLIN  HFA)  108 (90 Base) MCG/ACT inhaler Inhale 2 puffs into the lungs every 6 (six) hours as needed for wheezing or shortness of breath. 03/16/24  Yes Rothfuss, Jacob T, PA-C  atorvastatin  (LIPITOR) 40 MG tablet Take 1.5 tablets (60 mg total) by mouth daily. 08/10/24  Yes Rothfuss, Jacob T, PA-C  cholestyramine  (QUESTRAN ) 4 g packet Take 1 packet (4 g total) by mouth daily. Suggest starting out once daily, can increase up to three times a day as needed, can cause constipation 09/01/24  Yes May, Deanna J, NP  Continuous Glucose Receiver (FREESTYLE LIBRE 3 READER) DEVI 1 Device by Does not apply route daily. 12/16/23  Yes Rothfuss, Jacob T, PA-C  Continuous Glucose Sensor (FREESTYLE LIBRE 3 SENSOR) MISC Place 1 each onto the skin every 14 (fourteen) days. Place 1 sensor on the skin every 14 days. Use to check glucose continuously 12/16/23  Yes Rothfuss, Jacob T, PA-C  cyclobenzaprine  (FLEXERIL ) 10 MG tablet Take 1 tablet (10 mg total) by mouth 3 (three) times daily as needed for muscle spasms. 08/31/24  Yes Rothfuss, Jacob T, PA-C  empagliflozin  (JARDIANCE ) 25 MG TABS tablet Take 1 tablet (25 mg total) by mouth daily. 10/12/24  Yes Rothfuss, Jacob T, PA-C  Iron -Vitamin C (VITRON-C PO) Take by mouth daily.   Yes [provider]  metFORMIN  (GLUCOPHAGE -XR) 500 MG 24 hr tablet Take 2 tablets (1,000 mg total) by mouth 2 (two) times daily. Start with 2 tablets in the morning and one at night one week, then go to 2 tablets in the morning and 2 at night thereafter. 08/03/24  Yes Rothfuss, Jacob T, PA-C  insulin  glargine (LANTUS  SOLOSTAR) 100 UNIT/ML Solostar Pen Inject 25 Units into the skin at bedtime. 08/03/24   Rothfuss, Jacob T, PA-C  meloxicam  (MOBIC ) 7.5 MG tablet Take 1 tablet (7.5 mg total) by mouth daily. 01/27/24   Rothfuss, Jacob T, PA-C  olmesartan  (BENICAR ) 5 MG tablet Take 1 tablet (5 mg total) by mouth daily. 05/04/24   Rothfuss, Jacob T, PA-C    Current Facility-Administered Medications  Medication Dose  Route Frequency Provider Last Rate Last Admin   0.9 %  sodium chloride  infusion   Intravenous Continuous May, Deanna J, NP        Allergies as of 09/01/2024 - Review Complete 09/01/2024  Allergen Reaction Noted   Latex  09/12/2020    Family History  Adopted: Yes    Social History   Socioeconomic History   Marital status: Married    Spouse name: Not on file   Number of children: 0   Years of education: Not on file   Highest education level: Some college, no degree  Occupational History   Occupation: Associate Professor  Tobacco Use   Smoking status: Never    Passive exposure: Never   Smokeless tobacco: Never  Vaping Use   Vaping status: Never Used  Substance and Sexual Activity   Alcohol use: Not Currently   Drug use: Never   Sexual activity: Not Currently  Other Topics Concern   Not on file  Social History Narrative   Not on file   Social Drivers of Health   Financial Resource Strain: Low Risk  (08/28/2024)   Overall Financial Resource Strain (CARDIA)    Difficulty of Paying Living Expenses: Not hard at all  Recent Concern: Financial Resource Strain - Medium Risk (07/30/2024)   Overall Financial Resource Strain (CARDIA)    Difficulty of Paying Living Expenses: Somewhat hard  Food Insecurity: No Food Insecurity (08/28/2024)   Hunger Vital Sign    Worried About Running Out of Food in the Last Year: Never true    Ran Out of Food in the Last Year: Never true  Transportation Needs: No Transportation Needs (08/28/2024)   PRAPARE - Administrator, Civil Service (Medical): No    Lack of Transportation (Non-Medical): No  Physical Activity: Inactive (08/28/2024)   Exercise Vital Sign    Days of Exercise per Week: 2 days    Minutes of Exercise per Session: 0 min  Stress: Stress Concern Present (08/28/2024)   [employer] of Occupational Health - Occupational Stress Questionnaire    Feeling of Stress: To some extent  Social Connections: Moderately  Integrated (08/28/2024)   Social Connection and Isolation Panel    Frequency of Communication with Friends and Family: More than three times a week    Frequency of Social Gatherings with Friends and Family: Three times a week    Attends Religious Services: More than 4 times per year    Active Member of Clubs or Organizations: No    Attends Banker Meetings: Not on file    Marital Status: Married  Intimate Partner Violence: Patient Unable To Answer (10/12/2024)   Humiliation, Afraid, Rape, and Kick questionnaire    Fear of Current or Ex-Partner: Patient unable to answer    Emotionally Abused: Patient unable to answer    Physically Abused: Patient unable to answer    Sexually Abused: Patient unable to answer    Physical Exam: Vital signs in last 24 hours: @BP  (!) (P) 166/90  Pulse (P) 78   Temp (!) (P) 96.9 F (36.1 C) (Temporal)   Resp (P) 16   Ht (P) 5' (1.524 m)   Wt (!) (P) 154.2 kg   LMP 10/16/2024   SpO2 (P) 100%   BMI (P) 66.40 kg/m  GEN: NAD EYE: Sclerae anicteric ENT: MMM CV: Non-tachycardic Pulm: CTA b/l GI: Soft, NT/ND NEURO:  Alert & Oriented x 3   Sandor Flatter, DO [facility]   10/20/2024 9:50 AM

## 2024-10-20 NOTE — Interval H&P Note (Signed)
 History and Physical Interval Note:  10/20/2024 9:56 AM  Leah Mendoza  has presented today for surgery, with the diagnosis of Iron  deficiency anemia due to chronic blood loss R50.0,Special screening for malignant neoplasms, colon Z12.11.  The various methods of treatment have been discussed with the patient and family. After consideration of risks, benefits and other options for treatment, the patient has consented to  Procedure(s): EGD (ESOPHAGOGASTRODUODENOSCOPY) (N/A) COLONOSCOPY (N/A) as a surgical intervention.  The patient's history has been reviewed, patient examined, no change in status, stable for surgery.  I have reviewed the patient's chart and labs.  Questions were answered to the patient's satisfaction.     Sandor GAILS Gavin Telford

## 2024-10-20 NOTE — Op Note (Signed)
 [facility] Patient Name: Leah Mendoza Procedure Date: 10/20/2024 MRN: [id number] Attending MD: Sandor Flatter , MD, [id number] Date of Birth: 12-Jan-1976 CSN: [id number] Age: 48 Admit Type: Ambulatory Procedure:                Colonoscopy Indications:              Iron  deficiency anemia Providers:                Sandor Flatter, MD, Jacquelyn Jaci Pierce, RN,                            Fairy Marina, Technician Referring MD:              Medicines:                Monitored Anesthesia Care Complications:            No immediate complications. Estimated Blood Loss:     Estimated blood loss: none. Procedure:                Pre-Anesthesia Assessment:                           - Prior to the procedure, a History and Physical                            was performed, and patient medications and                            allergies were reviewed. The patient's tolerance of                            previous anesthesia was also reviewed. The risks                            and benefits of the procedure and the sedation                            options and risks were discussed with the patient.                            All questions were answered, and informed consent                            was obtained. Prior Anticoagulants: The patient has                            taken no anticoagulant or antiplatelet agents. ASA                            Grade Assessment: III - A patient with severe                            systemic disease. After reviewing the risks and  benefits, the patient was deemed in satisfactory                            condition to undergo the procedure.                           After obtaining informed consent, the colonoscope                            was passed under direct vision. Throughout the                            procedure, the patient's blood pressure, pulse, and                            oxygen  saturations were monitored continuously. The                            [id number]-HQ190L ([id number] Olympus colonoscope was                            introduced through the anus and advanced to the the                            terminal ileum. The colonoscopy was performed                            without difficulty. The patient tolerated the                            procedure well. The quality of the bowel                            preparation was good. The terminal ileum, ileocecal                            valve, appendiceal orifice, and rectum were                            photographed. Scope In: 11:03:46 AM Scope Out: 11:14:04 AM Scope Withdrawal Time: 0 hours 8 minutes 21 seconds  Total Procedure Duration: 0 hours 10 minutes 18 seconds  Findings:      The perianal and digital rectal examinations were normal.      The colon (entire examined portion) appeared normal.      Non-bleeding internal hemorrhoids were found during retroflexion. The       hemorrhoids were small and Grade I (internal hemorrhoids that do not       prolapse).      The terminal ileum appeared normal. Impression:               - The entire examined colon is normal.                           - Non-bleeding internal hemorrhoids.                           -  The examined portion of the ileum was normal.                           - No specimens collected. Moderate Sedation:      Not Applicable - Patient had care per Anesthesia. Recommendation:           - Patient has a contact number available for                            emergencies. The signs and symptoms of potential                            delayed complications were discussed with the                            patient. Return to normal activities tomorrow.                            Written discharge instructions were provided to the                            patient.                           - Resume previous diet.                           -  Continue present medications.                           - Repeat colonoscopy in 10 years for screening                            purposes.                           - If continued or recurrent iron  deficiency anemia,                            plan for further small bowel interrogation with                            Video Capsule Endoscopy (VCE).                           - Return to GI office PRN. Procedure Code(s):        --- Professional ---                           [phone], Colonoscopy, flexible; diagnostic, including                            collection of specimen(s) by brushing or washing,                            when performed (separate procedure) Diagnosis Code(s):        ---  Professional ---                           K64.0, First degree hemorrhoids                           D50.9, Iron  deficiency anemia, unspecified CPT copyright 2022 American Medical Association. All rights reserved. The codes documented in this report are preliminary and upon coder review may  be revised to meet current compliance requirements. Sandor Flatter, MD 10/20/2024 11:35:57 AM Number of Addenda: 0

## 2024-10-20 NOTE — Discharge Instructions (Signed)

## 2024-10-20 NOTE — Anesthesia Postprocedure Evaluation (Signed)
 Anesthesia Post Note  Patient: Leah Mendoza  Procedure(s) Performed: EGD (ESOPHAGOGASTRODUODENOSCOPY) COLONOSCOPY BIOPSY, SKIN, SUBCUTANEOUS TISSUE, OR MUCOUS MEMBRANE     Patient location during evaluation: PACU Anesthesia Type: MAC Level of consciousness: awake and alert Pain management: pain level controlled Vital Signs Assessment: post-procedure vital signs reviewed and stable Respiratory status: spontaneous breathing, nonlabored ventilation, respiratory function stable and patient connected to nasal cannula oxygen Cardiovascular status: stable and blood pressure returned to baseline Postop Assessment: no apparent nausea or vomiting Anesthetic complications: no   No notable events documented.  Last Vitals:  Vitals:   10/20/24 1130 10/20/24 1140  BP: 95/72 131/81  Pulse: 92 78  Resp: 19 18  Temp:    SpO2: 99% 96%    Last Pain:  Vitals:   10/20/24 1140  TempSrc:   PainSc: 0-No pain                 Sharilynn Cassity

## 2024-10-20 NOTE — Anesthesia Preprocedure Evaluation (Addendum)
 Anesthesia Evaluation  Patient identified by MRN, date of birth, ID band Patient awake    Reviewed: Allergy & Precautions, H&P , NPO status , Patient's Chart, lab work & pertinent test results  Airway Mallampati: II  TM Distance: >3 FB Neck ROM: Full    Dental no notable dental hx. (+) Teeth Intact, Dental Advisory Given   Pulmonary neg pulmonary ROS   Pulmonary exam normal breath sounds clear to auscultation       Cardiovascular Exercise Tolerance: Good hypertension, negative cardio ROS Normal cardiovascular exam Rhythm:Regular Rate:Normal  ECHO   1. Left ventricular ejection fraction, by estimation, is 60 to 65%. The  left ventricle has normal function. The left ventricle has no regional  wall motion abnormalities. There is mild left ventricular hypertrophy.  Left ventricular diastolic parameters  are consistent with Grade I diastolic dysfunction (impaired relaxation).  The average left ventricular global longitudinal strain is 19.7 %. The  global longitudinal strain is normal.   2. Right ventricular systolic function is normal. The right ventricular  size is normal. There is normal pulmonary artery systolic pressure.   3. The mitral valve is normal in structure. No evidence of mitral valve  regurgitation. No evidence of mitral stenosis.   4. The aortic valve is normal in structure. Aortic valve regurgitation is  not visualized. No aortic stenosis is present.   5. The inferior vena cava is normal in size with greater than 50%  respiratory variability, suggesting right atrial pressure of 3 mmHg.      Neuro/Psych negative neurological ROS  negative psych ROS   GI/Hepatic negative GI ROS, Neg liver ROS,,,  Endo/Other  negative endocrine ROSdiabetes, Well Controlled, Insulin  Dependent, Oral Hypoglycemic Agents  Class 4 obesity  Renal/GU Renal diseasenegative Renal ROS  negative genitourinary   Musculoskeletal negative  musculoskeletal ROS (+)    Abdominal   Peds negative pediatric ROS (+)  Hematology negative hematology ROS (+) Blood dyscrasia, anemia   Anesthesia Other Findings   Reproductive/Obstetrics negative OB ROS                              Anesthesia Physical Anesthesia Plan  ASA: 3  Anesthesia Plan: MAC   Post-op Pain Management: Minimal or no pain anticipated   Induction: Intravenous  PONV Risk Score and Plan: 3 and Propofol  infusion  Airway Management Planned: Mask, Natural Airway and Simple Face Mask  Additional Equipment: None  Intra-op Plan:   Post-operative Plan:   Informed Consent: I have reviewed the patients History and Physical, chart, labs and discussed the procedure including the risks, benefits and alternatives for the proposed anesthesia with the patient or authorized representative who has indicated his/her understanding and acceptance.       Plan Discussed with: Anesthesiologist  Anesthesia Plan Comments:          Anesthesia Quick Evaluation

## 2024-10-20 NOTE — Transfer of Care (Signed)
 Immediate Anesthesia Transfer of Care Note  Patient: Leah Mendoza  Procedure(s) Performed: EGD (ESOPHAGOGASTRODUODENOSCOPY) COLONOSCOPY BIOPSY, SKIN, SUBCUTANEOUS TISSUE, OR MUCOUS MEMBRANE  Patient Location: PACU  Anesthesia Type:MAC  Level of Consciousness: awake  Airway & Oxygen Therapy: Patient Spontanous Breathing and Patient connected to face mask oxygen  Post-op Assessment: Report given to RN and Post -op Vital signs reviewed and stable  Post vital signs: Reviewed and stable  Last Vitals:  Vitals Value Taken Time  BP 137/71 10/20/24 11:22  Temp 36.4 C 10/20/24 11:22  Pulse 102 10/20/24 11:23  Resp 24 10/20/24 11:23  SpO2 98 % 10/20/24 11:23  Vitals shown include unfiled device data.  Last Pain:  Vitals:   10/20/24 1122  TempSrc: Temporal  PainSc: 0-No pain         Complications: No notable events documented.

## 2024-10-20 NOTE — Op Note (Signed)
 [facility] Patient Name: Leah Mendoza Procedure Date: 10/20/2024 MRN: [id number] Attending MD: Sandor Flatter , MD, [id number] Date of Birth: 1976/03/24 CSN: [id number] Age: 48 Admit Type: Ambulatory Procedure:                Upper GI endoscopy Indications:              Iron  deficiency anemia Providers:                Sandor Flatter, MD, Jacquelyn Jaci Pierce, RN,                            Fairy Marina, Technician Referring MD:              Medicines:                Monitored Anesthesia Care Complications:            No immediate complications. Estimated Blood Loss:     Estimated blood loss was minimal. Procedure:                Pre-Anesthesia Assessment:                           - Prior to the procedure, a History and Physical                            was performed, and patient medications and                            allergies were reviewed. The patient's tolerance of                            previous anesthesia was also reviewed. The risks                            and benefits of the procedure and the sedation                            options and risks were discussed with the patient.                            All questions were answered, and informed consent                            was obtained. Prior Anticoagulants: The patient has                            taken no anticoagulant or antiplatelet agents. ASA                            Grade Assessment: III - A patient with severe                            systemic disease. After reviewing the risks and  benefits, the patient was deemed in satisfactory                            condition to undergo the procedure.                           After obtaining informed consent, the endoscope was                            passed under direct vision. Throughout the                            procedure, the patient's blood pressure, pulse, and                             oxygen saturations were monitored continuously. The                            [id number]-[id number] ([id number]) Olympus endoscope was introduced                            through the mouth, and advanced to the third part                            of duodenum. The upper GI endoscopy was                            accomplished without difficulty. The patient                            tolerated the procedure well. Scope In: Scope Out: Findings:      The examined esophagus was normal.      The Z-line was regular and was found 38 cm from the incisors.      The entire examined stomach was normal. Biopsies were taken with a cold       forceps for Helicobacter pylori testing. Estimated blood loss was       minimal.      The examined duodenum was normal. Biopsies were taken with a cold       forceps for histology. Estimated blood loss was minimal. Impression:               - Normal esophagus.                           - Z-line regular, 38 cm from the incisors.                           - Normal stomach. Biopsied.                           - Normal examined duodenum. Biopsied. Moderate Sedation:      Not Applicable - Patient had care per Anesthesia. Recommendation:           - Patient has a contact number available for  emergencies. The signs and symptoms of potential                            delayed complications were discussed with the                            patient. Return to normal activities tomorrow.                            Written discharge instructions were provided to the                            patient.                           - Resume previous diet.                           - Continue present medications.                           - Await pathology results.                           - Perform a colonoscopy today. Procedure Code(s):        --- Professional ---                           [phone], Esophagogastroduodenoscopy, flexible,                             transoral; with biopsy, single or multiple Diagnosis Code(s):        --- Professional ---                           D50.9, Iron  deficiency anemia, unspecified CPT copyright 2022 American Medical Association. All rights reserved. The codes documented in this report are preliminary and upon coder review may  be revised to meet current compliance requirements. Sandor Flatter, MD 10/20/2024 11:30:37 AM Number of Addenda: 0

## 2024-10-21 ENCOUNTER — Encounter (HOSPITAL_COMMUNITY): Payer: Self-pay | Admitting: Gastroenterology

## 2024-10-21 LAB — SURGICAL PATHOLOGY

## 2024-10-26 ENCOUNTER — Ambulatory Visit: Payer: Self-pay | Admitting: Gastroenterology

## 2024-11-06 ENCOUNTER — Telehealth (HOSPITAL_BASED_OUTPATIENT_CLINIC_OR_DEPARTMENT_OTHER): Payer: Self-pay

## 2024-11-06 NOTE — Telephone Encounter (Signed)
 Patient was identified as falling into the [place] Measure - Diabetes.   Patient was: Appointment already scheduled for:  11/13/24 with endocrinology.

## 2024-11-13 ENCOUNTER — Ambulatory Visit: Admitting: "Endocrinology

## 2024-11-20 ENCOUNTER — Encounter (HOSPITAL_BASED_OUTPATIENT_CLINIC_OR_DEPARTMENT_OTHER): Payer: Self-pay | Admitting: Student

## 2024-11-25 ENCOUNTER — Ambulatory Visit (HOSPITAL_BASED_OUTPATIENT_CLINIC_OR_DEPARTMENT_OTHER)
Admission: RE | Admit: 2024-11-25 | Discharge: 2024-11-25 | Disposition: A | Discharge status: Home / Self Care | Source: Ambulatory Visit | Attending: Family Medicine | Admitting: Family Medicine

## 2024-11-25 ENCOUNTER — Encounter (HOSPITAL_BASED_OUTPATIENT_CLINIC_OR_DEPARTMENT_OTHER): Payer: Self-pay

## 2024-11-25 VITALS — BP 168/86 | HR 82 | Temp 98.2°F | Resp 20

## 2024-11-25 DIAGNOSIS — M546 Pain in thoracic spine: Secondary | ICD-10-CM | POA: Diagnosis not present

## 2024-11-25 LAB — POCT URINE DIPSTICK
Bilirubin, UA: NEGATIVE
Blood, UA: NEGATIVE
Glucose, UA: 500 mg/dL — AB
Ketones, POC UA: NEGATIVE mg/dL
Leukocytes, UA: NEGATIVE
Nitrite, UA: NEGATIVE
POC PROTEIN,UA: NEGATIVE
Spec Grav, UA: 1.025
Urobilinogen, UA: 0.2 U/dL
pH, UA: 5.5

## 2024-11-25 NOTE — ED Provider Notes (Signed)
 " [facility]    CSN: [id number] Arrival date & time: 11/25/24  0820      History   Chief Complaint Chief Complaint  Patient presents with   Abdominal Pain    And I have a health questionnaire paper that needs to be filled out for a job - Entered by patient    HPI Leah Mendoza is a 49 y.o. female.   Patient is a 83-year-old female who presents today with right flank pain since Thanksgiving. Has not been evaluated by pcp for this. States has IDDM. Reports feels as if she has to void often but not sure if that is related to her Diabetes. No pain with urination. No hx of kidney stones. No visible blood in urine. Patient has a libre freestyle and blood sugar 209 this morning.  Has used muscle relaxer with some relief.  Denies any injuries or heavy lifting.    Abdominal Pain   Past Medical History:  Diagnosis Date   Asthma    Diabetes mellitus without complication (HCC)    Folate deficiency 11/24/2021   HLD (hyperlipidemia)    Hypertension    Iron  deficiency anemia 08/25/2020   Iron  deficiency anemia secondary to blood loss (chronic)     Patient Active Problem List   Diagnosis Date Noted   Grade I internal hemorrhoids 10/20/2024   Hyperlipidemia associated with type 2 diabetes mellitus (HCC) 08/31/2024   AKI (acute kidney injury) 08/07/2024   Morbid obesity (HCC) 01/27/2024   Diabetes mellitus without complication (HCC)    Cellulitis at injection site 10/03/2023   Leukocytosis 09/18/2023   Hypertension    Folate deficiency 11/24/2021   Iron  deficiency anemia 08/25/2020    Past Surgical History:  Procedure Laterality Date   BIOPSY OF SKIN SUBCUTANEOUS TISSUE AND/OR MUCOUS MEMBRANE  10/20/2024   Procedure: BIOPSY, SKIN, SUBCUTANEOUS TISSUE, OR MUCOUS MEMBRANE;  Surgeon: San Sandor GAILS, DO;  Location: [facility];  Service: Gastroenterology;;   CHOLECYSTECTOMY     COLONOSCOPY N/A 10/20/2024   Procedure: COLONOSCOPY;  Surgeon: San Sandor GAILS, DO;  Location: [facility];  Service: Gastroenterology;  Laterality: N/A;   ESOPHAGOGASTRODUODENOSCOPY N/A 10/20/2024   Procedure: EGD (ESOPHAGOGASTRODUODENOSCOPY);  Surgeon: San Sandor GAILS, DO;  Location: [facility];  Service: Gastroenterology;  Laterality: N/A;   KNEE ARTHROSCOPY W/ MENISCAL REPAIR Right 05/10/2023    OB History   No obstetric history on file.      Home Medications    Prior to Admission medications  Medication Sig Start Date End Date Taking? Authorizing Provider  albuterol  (PROVENTIL ) (2.5 MG/3ML) 0.083% nebulizer solution Take 3 mLs (2.5 mg total) by nebulization every 6 (six) hours as needed for wheezing or shortness of breath. 12/16/23   Rothfuss, Jacob T, PA-C  albuterol  (VENTOLIN  HFA) 108 (90 Base) MCG/ACT inhaler Inhale 2 puffs into the lungs every 6 (six) hours as needed for wheezing or shortness of breath. 03/16/24   Rothfuss, Jacob T, PA-C  atorvastatin  (LIPITOR) 40 MG tablet Take 1.5 tablets (60 mg total) by mouth daily. 08/10/24   Rothfuss, Jacob T, PA-C  cholestyramine  (QUESTRAN ) 4 g packet Take 1 packet (4 g total) by mouth daily. Suggest starting out once daily, can increase up to three times a day as needed, can cause constipation 09/01/24   May, Deanna J, NP  Continuous Glucose Receiver (FREESTYLE LIBRE 3 READER) DEVI 1 Device by Does not apply route daily. 12/16/23   Rothfuss, Jacob T, PA-C  Continuous Glucose Sensor (FREESTYLE LIBRE 3  SENSOR) MISC Place 1 each onto the skin every 14 (fourteen) days. Place 1 sensor on the skin every 14 days. Use to check glucose continuously 12/16/23   Rothfuss, Jacob T, PA-C  cyclobenzaprine  (FLEXERIL ) 10 MG tablet Take 1 tablet (10 mg total) by mouth 3 (three) times daily as needed for muscle spasms. 08/31/24   Rothfuss, Jacob T, PA-C  empagliflozin  (JARDIANCE ) 25 MG TABS tablet Take 1 tablet (25 mg total) by mouth daily. 10/12/24   Rothfuss, Jacob T, PA-C  insulin  glargine (LANTUS  SOLOSTAR) 100 UNIT/ML Solostar Pen  Inject 25 Units into the skin at bedtime. 08/03/24   Rothfuss, Jacob T, PA-C  Iron -Vitamin C (VITRON-C PO) Take by mouth daily.    [provider]  meloxicam  (MOBIC ) 7.5 MG tablet Take 1 tablet (7.5 mg total) by mouth daily. 01/27/24   Rothfuss, Jacob T, PA-C  metFORMIN  (GLUCOPHAGE -XR) 500 MG 24 hr tablet Take 2 tablets (1,000 mg total) by mouth 2 (two) times daily. Start with 2 tablets in the morning and one at night one week, then go to 2 tablets in the morning and 2 at night thereafter. 08/03/24   Rothfuss, Jacob T, PA-C  olmesartan  (BENICAR ) 5 MG tablet Take 1 tablet (5 mg total) by mouth daily. 05/04/24   Rothfuss, Jacob T, PA-C    Family History Family History  Adopted: Yes    Social History Social History[1]   Allergies   Latex   Review of Systems Review of Systems  Gastrointestinal:  Positive for abdominal pain.     Physical Exam Triage Vital Signs ED Triage Vitals  Encounter Vitals Group     BP 11/25/24 0829 (!) 168/86     Girls Systolic BP Percentile --      Girls Diastolic BP Percentile --      Boys Systolic BP Percentile --      Boys Diastolic BP Percentile --      Pulse Rate 11/25/24 0829 82     Resp 11/25/24 0829 20     Temp 11/25/24 0829 98.2 F (36.8 C)     Temp Source 11/25/24 0829 Oral     SpO2 11/25/24 0829 98 %     Weight --      Height --      Head Circumference --      Peak Flow --      Pain Score 11/25/24 0831 8     Pain Loc --      Pain Education --      Exclude from Growth Chart --    No data found.  Updated Vital Signs BP (!) 168/86 (BP Location: Right Arm)   Pulse 82   Temp 98.2 F (36.8 C) (Oral)   Resp 20   SpO2 98%   Visual Acuity Right Eye Distance:   Left Eye Distance:   Bilateral Distance:    Right Eye Near:   Left Eye Near:    Bilateral Near:     Physical Exam Vitals and nursing note reviewed.  Constitutional:      General: She is not in acute distress.    Appearance: Normal appearance. She is not  ill-appearing, toxic-appearing or diaphoretic.  Pulmonary:     Effort: Pulmonary effort is normal.  Musculoskeletal:       Back:  Neurological:     Mental Status: She is alert.  Psychiatric:        Mood and Affect: Mood normal.      UC Treatments / Results  Labs (  all labs ordered are listed, but only abnormal results are displayed) Labs Reviewed  POCT URINE DIPSTICK - Abnormal; Notable for the following components:      Result Value   Clarity, UA cloudy (*)    Glucose, UA =500 (*)    All other components within normal limits    EKG   Radiology No results found.  Procedures Procedures (including critical care time)  Medications Ordered in UC Medications - No data to display  Initial Impression / Assessment and Plan / UC Course  I have reviewed the triage vital signs and the nursing notes.  Pertinent labs & imaging results that were available during my care of the patient were reviewed by me and considered in my medical decision making (see chart for details).     Acute right sided thoracic back pain- Urine normal. The pain sounds muscular. Keep doing the muscle relaxer as needed. You can rub some voltaren gel on the area or lidocaine  patch. Heat, Stretching. See Jacob for follow up as needed for continued issues.  Final Clinical Impressions(s) / UC Diagnoses   Final diagnoses:  Acute right-sided thoracic back pain     Discharge Instructions      Urine normal. The pain sounds muscular. Keep doing the muscle relaxer as needed. You can rub some voltaren gel on the area or lidocaine  patch. Heat, Stretching. See Jacob for follow up as needed      ED Prescriptions   None    PDMP not reviewed this encounter.     [1]  Social History Tobacco Use   Smoking status: Never    Passive exposure: Never   Smokeless tobacco: Never  Vaping Use   Vaping status: Never Used  Substance Use Topics   Alcohol use: Not Currently   Drug use: Never     Adah Wilbert LABOR, FNP 11/25/24 0856  "

## 2024-11-25 NOTE — ED Triage Notes (Signed)
 Right flank pain since Thanksgiving. Has not been evaluated by pcp for this. States has IDDM. Reports feels as if she has to void often but not sure if that is related to her Diabetes. No pain with urination. No hx of kidney stones. No visible blood in urine. Patient has a libre freestyle and blood sugar 209 this morning.

## 2024-11-25 NOTE — Discharge Instructions (Signed)
 Urine normal. The pain sounds muscular. Keep doing the muscle relaxer as needed. You can rub some voltaren gel on the area or lidocaine  patch. Heat, Stretching. See Jacob for follow up as needed

## 2024-11-30 ENCOUNTER — Encounter: Payer: Self-pay | Admitting: Hematology and Oncology

## 2024-11-30 ENCOUNTER — Ambulatory Visit (INDEPENDENT_AMBULATORY_CARE_PROVIDER_SITE_OTHER): Admitting: Student

## 2024-11-30 ENCOUNTER — Encounter (HOSPITAL_BASED_OUTPATIENT_CLINIC_OR_DEPARTMENT_OTHER): Payer: Self-pay | Admitting: Student

## 2024-11-30 VITALS — BP 159/86 | HR 83 | Temp 98.4°F | Resp 16 | Ht 60.5 in | Wt 344.4 lb

## 2024-11-30 DIAGNOSIS — E119 Type 2 diabetes mellitus without complications: Secondary | ICD-10-CM | POA: Diagnosis not present

## 2024-11-30 DIAGNOSIS — Z6841 Body Mass Index (BMI) 40.0 and over, adult: Secondary | ICD-10-CM

## 2024-11-30 DIAGNOSIS — M545 Low back pain, unspecified: Secondary | ICD-10-CM

## 2024-11-30 NOTE — Progress Notes (Signed)
 "  Acute Office Visit  Subjective:     Patient ID: Leah Mendoza, female    DOB: Apr 08, 1976, 49 y.o.   MRN: [id number]  Chief Complaint  Patient presents with   Back Pain    Was at [facility] on 11/25/2024 for back pain. Was told she might have pulled a muscle since flexeril  helped. They did a UA on her.   Form Completion    Has a form she needs completed for her job.    Discussed the use of AI scribe software for clinical note transcription with the patient, who gave verbal consent to proceed.  History of Present Illness   Leah Mendoza is a 48 year old female with diabetes who presents with back pain.  She has been experiencing back pain since around Thanksgiving and Christmas, primarily localized to the right side near her oblique muscles. The pain is described as deep and is exacerbated by bending to the side. Morning stiffness improves with a hot shower. She denies any recent history of pulling or lifting heavy objects but mentions bending down to play with a seven-year-old client without lifting her. She took a muscle relaxer which provided minimal relief but caused drowsiness. The pain has persisted for approximately two weeks.  She has a history of diabetes with blood sugar levels remaining high, with a recent reading of 196 mg/dL. She is currently using 25 units of Lantus  at bedtime, which was previously 35 units. An endocrinology appointment is scheduled for February 2nd, which was rescheduled due to the provider's vacation.  No recent travel out of the country or exposure to tuberculosis. No chronic cough, hemoptysis, or shortness of breath. She is up to date on vaccinations and has recently had a tuberculosis skin test, with results sent to her employer. She reports that she does not feel the need for any limitations due to her back pain at this time.      Review of Systems  Musculoskeletal:  Positive for back pain.   Per HPI     Objective:    BP (!)  159/86   Pulse 83   Temp 98.4 F (36.9 C) (Oral)   Resp 16   Ht 5' 0.5 (1.537 m)   Wt (!) 344 lb 6.4 oz (156.2 kg)   SpO2 97%   BMI 66.15 kg/m  BP Readings from Last 3 Encounters:  11/30/24 (!) 159/86  11/25/24 (!) 168/86  10/20/24 131/81   Wt Readings from Last 3 Encounters:  11/30/24 (!) 344 lb 6.4 oz (156.2 kg)  10/20/24 (!) (P) 340 lb (154.2 kg)  10/12/24 (!) 337 lb 3.2 oz (153 kg)   SpO2 Readings from Last 3 Encounters:  11/30/24 97%  11/25/24 98%  10/20/24 96%      Physical Exam Constitutional:      General: She is not in acute distress.    Appearance: Normal appearance. She is not ill-appearing.  HENT:     Head: Normocephalic and atraumatic.     Ears:     Comments: Finger rub and whisper test normal    Nose: Nose normal.  Eyes:     General: No scleral icterus.    Conjunctiva/sclera: Conjunctivae normal.  Cardiovascular:     Rate and Rhythm: Normal rate and regular rhythm.     Heart sounds: Normal heart sounds. No murmur heard.    No friction rub.  Pulmonary:     Effort: Pulmonary effort is normal. No respiratory distress.  Breath sounds: Normal breath sounds. No wheezing, rhonchi or rales.  Musculoskeletal:        General: Normal range of motion.     Comments: Back Exam:  Inspection: Unremarkable  Motion: Flexion 45 deg, Extension 45 deg, Side Bending to 45 deg bilaterally,  Rotation to 45 deg bilaterally  SLR sitting: Negative  Palpable tenderness: R sided TTP around R obliques. Sensory change: Grossly intact Gait unremarkable.    Skin:    General: Skin is warm and dry.     Coloration: Skin is not jaundiced or pale.  Neurological:     General: No focal deficit present.     Mental Status: She is alert.  Psychiatric:        Mood and Affect: Mood normal.        Behavior: Behavior normal.    Vision Screening   Right eye Left eye Both eyes  Without correction     With correction 20/70 20/70 20/70      No results found for any visits on  11/30/24.      Assessment & Plan:   Assessment and Plan    Acute back pain Likely due to muscle strain, localized to the right side around the oblique muscles. No bruising or kidney involvement. Pain exacerbated by bending and relieved by hot showers. No radicular symptoms. Likely muscular in origin. - Advised use of Tylenol  and Flexeril  for pain management. - Instructed on proper lifting techniques to prevent further strain. - If no improvement in six weeks, will refer to physical therapy.  Type 2 diabetes mellitus Elevated blood glucose levels. Current Lantus  dose is 25 units at bedtime, previously higher at 35 units. Blood glucose level at 196 mg/dL. Endocrinology appointment rescheduled to February 2nd due to provider vacation. - Increased Lantus  to 30 units nightly. We have tried this multiple times in the past but she has had issues titrating on her own. - Continue follow up with endocrinology on February 2nd.  General Health Maintenance Vision assessment and tuberculosis skin test discussed. No recent travel or exposure to tuberculosis. Up to date on vaccines. - No tuberculosis skin test needed as recent results were satisfactory.  - Poor eye sight even with glasses, needs to follow up with optometry prior to starting job     No orders of the defined types were placed in this encounter.  I personally spent a total of 26 minutes in the care of the patient today including preparing to see the patient, getting/reviewing separately obtained history, performing a medically appropriate exam/evaluation, counseling and educating, placing orders, and documenting clinical information in the EHR.  Return if symptoms worsen or fail to improve.  Lakina Mcintire T Ariel Dimitri, PA-C   "

## 2024-11-30 NOTE — Patient Instructions (Addendum)
 It was nice to see you today!  As we discussed in clinic: - Please schedule a papsmear and eye exam as soon as you can. - Increase to 30 units of lantus  nightly  If you have any problems before your next visit feel free to message me via MyChart (minor issues or questions) or call the office, otherwise you may reach out to schedule an office visit.  Thank you! Aarish Rockers, PA-C

## 2024-12-21 ENCOUNTER — Ambulatory Visit: Admitting: "Endocrinology

## 2025-01-25 ENCOUNTER — Ambulatory Visit: Admitting: "Endocrinology

## 2025-02-09 ENCOUNTER — Ambulatory Visit (HOSPITAL_BASED_OUTPATIENT_CLINIC_OR_DEPARTMENT_OTHER): Admitting: Student

## 2025-03-17 ENCOUNTER — Inpatient Hospital Stay

## 2025-03-17 ENCOUNTER — Inpatient Hospital Stay: Admitting: Oncology
